# Patient Record
Sex: Female | Born: 1935 | Race: Black or African American | Hispanic: No | Smoking: Never smoker
Health system: Southern US, Community
[De-identification: ages and names within clinical notes are randomized; demographics above are authoritative.]

## PROBLEM LIST (undated history)

## (undated) DIAGNOSIS — C801 Malignant (primary) neoplasm, unspecified: Secondary | ICD-10-CM

## (undated) DIAGNOSIS — I10 Essential (primary) hypertension: Secondary | ICD-10-CM

## (undated) DIAGNOSIS — J45909 Unspecified asthma, uncomplicated: Secondary | ICD-10-CM

## (undated) HISTORY — PX: KNEE SURGERY: SHX244

## (undated) HISTORY — DX: Essential (primary) hypertension: I10

## (undated) HISTORY — PX: ORIF FIBULA FRACTURE: SHX2121

## (undated) HISTORY — DX: Unspecified asthma, uncomplicated: J45.909

---

## 2010-08-28 ENCOUNTER — Inpatient Hospital Stay (INDEPENDENT_AMBULATORY_CARE_PROVIDER_SITE_OTHER)
Admission: RE | Admit: 2010-08-28 | Discharge: 2010-08-28 | Disposition: A | Discharge status: Home / Self Care | Payer: 59 | Source: Ambulatory Visit | Attending: Family Medicine | Admitting: Family Medicine

## 2010-08-28 DIAGNOSIS — I10 Essential (primary) hypertension: Secondary | ICD-10-CM

## 2010-08-28 LAB — POCT I-STAT, CHEM 8
BUN: 16 mg/dL (ref 6–23)
Calcium, Ion: 1.23 mmol/L (ref 1.12–1.32)
Chloride: 102 mEq/L (ref 96–112)
Glucose, Bld: 112 mg/dL — ABNORMAL HIGH (ref 70–99)

## 2013-04-06 ENCOUNTER — Telehealth: Payer: Self-pay

## 2013-04-06 NOTE — Telephone Encounter (Signed)
No answer no VM

## 2013-04-09 ENCOUNTER — Encounter: Payer: Self-pay | Admitting: Family Medicine

## 2013-04-09 ENCOUNTER — Encounter: Payer: Self-pay | Admitting: General Practice

## 2013-04-09 ENCOUNTER — Ambulatory Visit (INDEPENDENT_AMBULATORY_CARE_PROVIDER_SITE_OTHER): Payer: Medicare HMO | Admitting: Family Medicine

## 2013-04-09 VITALS — BP 136/80 | HR 83 | Temp 98.1°F | Resp 16 | Ht 64.0 in | Wt 211.5 lb

## 2013-04-09 DIAGNOSIS — E669 Obesity, unspecified: Secondary | ICD-10-CM

## 2013-04-09 DIAGNOSIS — H409 Unspecified glaucoma: Secondary | ICD-10-CM

## 2013-04-09 DIAGNOSIS — I10 Essential (primary) hypertension: Secondary | ICD-10-CM

## 2013-04-09 LAB — HEPATIC FUNCTION PANEL
ALBUMIN: 3.9 g/dL (ref 3.5–5.2)
ALK PHOS: 58 U/L (ref 39–117)
ALT: 13 U/L (ref 0–35)
AST: 18 U/L (ref 0–37)
BILIRUBIN DIRECT: 0.1 mg/dL (ref 0.0–0.3)
Total Bilirubin: 0.8 mg/dL (ref 0.3–1.2)
Total Protein: 7.5 g/dL (ref 6.0–8.3)

## 2013-04-09 LAB — LDL CHOLESTEROL, DIRECT: LDL DIRECT: 135.7 mg/dL

## 2013-04-09 LAB — BASIC METABOLIC PANEL
BUN: 10 mg/dL (ref 6–23)
CALCIUM: 9.7 mg/dL (ref 8.4–10.5)
CO2: 29 mEq/L (ref 19–32)
Chloride: 101 mEq/L (ref 96–112)
Creatinine, Ser: 0.8 mg/dL (ref 0.4–1.2)
GFR: 86.97 mL/min (ref >60.00)
GLUCOSE: 98 mg/dL (ref 70–99)
Potassium: 3.3 mEq/L — ABNORMAL LOW (ref 3.5–5.1)
SODIUM: 140 meq/L (ref 135–145)

## 2013-04-09 LAB — CBC WITH DIFFERENTIAL/PLATELET
BASOS ABS: 0 10*3/uL (ref 0.0–0.1)
Basophils Relative: 0.3 % (ref 0.0–3.0)
Eosinophils Absolute: 0.1 10*3/uL (ref 0.0–0.7)
Eosinophils Relative: 1.2 % (ref 0.0–5.0)
HCT: 38.4 % (ref 36.0–46.0)
HEMOGLOBIN: 12.7 g/dL (ref 12.0–15.0)
LYMPHS ABS: 1.6 10*3/uL (ref 0.7–4.0)
Lymphocytes Relative: 17.9 % (ref 12.0–46.0)
MCHC: 33 g/dL (ref 30.0–36.0)
MCV: 90.7 fl (ref 78.0–100.0)
MONO ABS: 0.6 10*3/uL (ref 0.1–1.0)
Monocytes Relative: 6.6 % (ref 3.0–12.0)
NEUTROS PCT: 74 % (ref 43.0–77.0)
Neutro Abs: 6.5 10*3/uL (ref 1.4–7.7)
Platelets: 194 10*3/uL (ref 150.0–400.0)
RBC: 4.24 Mil/uL (ref 3.87–5.11)
RDW: 14.3 % (ref 11.5–14.6)
WBC: 8.8 10*3/uL (ref 4.5–10.5)

## 2013-04-09 LAB — LIPID PANEL
Cholesterol: 270 mg/dL — ABNORMAL HIGH (ref 0–200)
HDL: 104.9 mg/dL (ref >39.00)
TRIGLYCERIDES: 60 mg/dL (ref 0.0–149.0)
Total CHOL/HDL Ratio: 3
VLDL: 12 mg/dL (ref 0.0–40.0)

## 2013-04-09 LAB — TSH: TSH: 1.72 u[IU]/mL (ref 0.35–5.50)

## 2013-04-09 MED ORDER — LOSARTAN POTASSIUM 100 MG PO TABS: 100.0000 mg | ORAL_TABLET | Freq: Every day | ORAL | Status: DC

## 2013-04-09 MED ORDER — HYDROCHLOROTHIAZIDE 12.5 MG PO TABS: 12.5000 mg | ORAL_TABLET | Freq: Every day | ORAL | Status: DC

## 2013-04-09 NOTE — Progress Notes (Signed)
Pre visit review using our clinic review tool, if applicable. No additional management support is needed unless otherwise documented below in the visit note. 

## 2013-04-09 NOTE — Assessment & Plan Note (Signed)
New to provider, ongoing for pt.  Pt reports med is causing him dizziness.  Will split med into 2 components to try and lessen dizziness.  Check labs.  Will continue to follow.

## 2013-04-09 NOTE — Patient Instructions (Signed)
Follow up in 1 month to recheck BP Start the Hydrochlorothiazide daily, add the Losartan daily (2 pills, once daily) for blood pressure We'll notify you of your lab results and make any changes if needed Try and make healthy food choices and get regular exercise Call with any questions or concerns Welcome!  We're glad to have you!

## 2013-04-09 NOTE — Assessment & Plan Note (Signed)
New to provider, ongoing for pt.  Unable to exercise due to knee arthritis.  Not following any particular diet.  Check labs to risk stratify.  Will follow.

## 2013-04-09 NOTE — Assessment & Plan Note (Signed)
New to provider, ongoing for pt.  Following regularly w/ ophtho.

## 2013-04-09 NOTE — Progress Notes (Signed)
Subjective:    Patient ID: Hayley Wilson, female    DOB: 06/22/1934, 79 y.o.   MRN: [id number]  HPI New to establish.  Previous MD- [facility]  HTN- chronic problem, on Diovan HCT.  Was taking 1/2 tab daily due to dizziness.  Can take HCTZ alone w/out dizziness.  No CP, SOB, HAs, visual changes, edema.  Glaucoma- following regularly w/ Dr Chiquita Loth at [facility] Maintenance- overdue on PE, not getting mammograms- not interested.  Has never had colonoscopy, 'i don't want one'.  Has never had bone density, 'i don't think so'.  Refusing pneumonia.   Review of Systems For ROS see HPI     Objective:   Physical Exam  Vitals reviewed. Constitutional: She is oriented to person, place, and time. She appears well-developed and well-nourished. No distress.  obese  HENT:  Head: Normocephalic and atraumatic.  Eyes: Conjunctivae and EOM are normal. Pupils are equal, round, and reactive to light.  Neck: Normal range of motion. Neck supple. No thyromegaly present.  Cardiovascular: Normal rate, regular rhythm, normal heart sounds and intact distal pulses.   No murmur heard. Pulmonary/Chest: Effort normal and breath sounds normal. No respiratory distress.  Abdominal: Soft. She exhibits no distension. There is no tenderness.  Musculoskeletal: She exhibits no edema.  Lymphadenopathy:    She has no cervical adenopathy.  Neurological: She is alert and oriented to person, place, and time.  Skin: Skin is warm and dry.  Psychiatric: She has a normal mood and affect. Her behavior is normal.          Assessment & Plan:

## 2013-04-10 NOTE — Telephone Encounter (Signed)
Unable to reach pre visit.  

## 2013-05-01 ENCOUNTER — Telehealth: Payer: Self-pay | Admitting: Family Medicine

## 2013-05-01 NOTE — Telephone Encounter (Signed)
Relevant patient education mailed to patient.  

## 2013-05-10 ENCOUNTER — Encounter: Payer: Self-pay | Admitting: Family Medicine

## 2013-05-10 ENCOUNTER — Ambulatory Visit (INDEPENDENT_AMBULATORY_CARE_PROVIDER_SITE_OTHER): Payer: Medicare HMO | Admitting: Family Medicine

## 2013-05-10 VITALS — BP 126/80 | HR 81 | Temp 98.4°F | Resp 16 | Wt 206.2 lb

## 2013-05-10 DIAGNOSIS — I10 Essential (primary) hypertension: Secondary | ICD-10-CM

## 2013-05-10 LAB — BASIC METABOLIC PANEL WITH GFR
BUN: 21 mg/dL (ref 6–23)
CO2: 30 meq/L (ref 19–32)
Calcium: 9.3 mg/dL (ref 8.4–10.5)
Chloride: 99 meq/L (ref 96–112)
Creatinine, Ser: 1 mg/dL (ref 0.4–1.2)
GFR: 70.78 mL/min
Glucose, Bld: 90 mg/dL (ref 70–99)
Potassium: 3.5 meq/L (ref 3.5–5.1)
Sodium: 138 meq/L (ref 135–145)

## 2013-05-10 NOTE — Patient Instructions (Signed)
Schedule your complete physical in 6 months Continue the HCTZ, STOP the losartan Call with any questions or concerns Happy Valentine's Day!!!

## 2013-05-10 NOTE — Assessment & Plan Note (Signed)
Improved.  Pt unable to tolerate ARB due to HA and dizziness.  Continue HCTZ daily.  Check BMP due to mildly low K+ at last visit.

## 2013-05-10 NOTE — Progress Notes (Signed)
Pre visit review using our clinic review tool, if applicable. No additional management support is needed unless otherwise documented below in the visit note. 

## 2013-05-10 NOTE — Progress Notes (Signed)
Subjective:    Patient ID: Hayley Wilson, female    DOB: 02-24-1935, 79 y.o.   MRN: [id number]  HPI HTN- pt had Diovan HCT split into 2 components at last visit (Losartan and HCTZ).  Pt notes that Losartan, similar to Diovan, causes HAs and dizziness.  Stopped taking ARB but continued HCTZ w/o difficulty.  BP well controlled on just HCTZ.  No CP, SOB, HAs, visual changes, edema.   Review of Systems For ROS see HPI     Objective:   Physical Exam  Constitutional: She is oriented to person, place, and time. She appears well-developed and well-nourished. No distress.  HENT:  Head: Normocephalic and atraumatic.  Eyes: Conjunctivae and EOM are normal. Pupils are equal, round, and reactive to light.  Neck: Normal range of motion. Neck supple. No thyromegaly present.  Cardiovascular: Normal rate, regular rhythm, normal heart sounds and intact distal pulses.   No murmur heard. Pulmonary/Chest: Effort normal and breath sounds normal. No respiratory distress.  Abdominal: Soft. She exhibits no distension. There is no tenderness.  Musculoskeletal: She exhibits no edema.  Lymphadenopathy:    She has no cervical adenopathy.  Neurological: She is alert and oriented to person, place, and time.  Skin: Skin is warm and dry.  Psychiatric: She has a normal mood and affect. Her behavior is normal.          Assessment & Plan:

## 2013-05-11 ENCOUNTER — Telehealth: Payer: Self-pay | Admitting: Family Medicine

## 2013-05-11 ENCOUNTER — Encounter: Payer: Self-pay | Admitting: General Practice

## 2013-05-11 NOTE — Telephone Encounter (Signed)
Relevant patient education mailed to patient.  

## 2013-10-29 ENCOUNTER — Encounter: Payer: Medicare HMO | Admitting: Family Medicine

## 2013-12-24 ENCOUNTER — Telehealth: Payer: Self-pay | Admitting: Family Medicine

## 2013-12-24 NOTE — Telephone Encounter (Signed)
Patient Information:  Caller Name: Deziah  Phone: [phone]  Patient: Hayley Wilson  Gender: Female  DOB: 11-27-1934  Age: 79 Years  PCP: Sheliah Hatch.  Office Follow Up:  Does the office need to follow up with this patient?: Yes  Instructions For The Office: Please review, contact patient about her next step for her care. She can be reached at  [phone], please.  RN Note: Transferred from office/Stephanie requesting to determine if patient needs to be admitted to ER. When asking patient if she would be able to get to office today to be seen, told any movement from her home has to be 911 since on second floor.   Says she is wanting to be admitted to hospital, needing bed with equipment to be able to move some.  Symptoms  Reason For Call & Symptoms: In dental office laying back in chair for lengthy time on Wed 9/16.  When she got up seemed okay then turned around - did not feel dizzy but felt she was going down, tried to catch wall but was partition that did not hold, fell onto left hip.  Thought she was ok so went home.  Seen in ER Thurs 9/17 with xrays - Dx fractured pelvis.  Using walker but very slowly, told not to put much weight on left leg.  Now feeling bad since Thursday 9/24, no energy, just feels terrible.  Both legs and feet at ankles have become numb like she has been laying on them, does not go away.  Pain decreased when taking Motrin but still pain.  No way to get comfortable at all when sitting due to increased pain.  Not eating as well but trying to drink plenty of fluids.  Notes head feeling funny now but does not feel dizzy - usually lasting only few minutes at a time.  "just feels terrible"  Reviewed Health History In EMR: Yes  Reviewed Medications In EMR: Yes  Reviewed Allergies In EMR: Yes  Reviewed Surgeries / Procedures: No  Date of Onset of Symptoms: 12/12/2013  Treatments Tried: Motrin helps some  Treatments Tried Worked: Yes  Guideline(s) Used:  Hip  Injury  Disposition Per Guideline:   See Today in Office  Reason For Disposition Reached:   Patient wants to be seen  Advice Given:  N/A  Patient Refused Recommendation:  Patient Refused Care Advice  Can only come to  office using 911 due to second floor residence.  Wanting to be admitted to hospital until she feels better.

## 2013-12-24 NOTE — Telephone Encounter (Signed)
error 

## 2013-12-24 NOTE — Telephone Encounter (Signed)
Pt prefers to be admitted.  Therefore patient was advised to call 911 and explain to them her symptoms.  Pt stated understanding and agreed.  No further needs voiced at this time.

## 2013-12-25 DIAGNOSIS — Z0279 Encounter for issue of other medical certificate: Secondary | ICD-10-CM

## 2013-12-26 ENCOUNTER — Emergency Department (HOSPITAL_COMMUNITY): Payer: Medicare HMO

## 2013-12-26 ENCOUNTER — Emergency Department (HOSPITAL_COMMUNITY)
Admission: EM | Admit: 2013-12-26 | Discharge: 2013-12-26 | Disposition: A | Discharge status: Home / Self Care | Payer: Medicare HMO | Attending: Emergency Medicine | Admitting: Emergency Medicine

## 2013-12-26 ENCOUNTER — Encounter (HOSPITAL_COMMUNITY): Payer: Self-pay | Admitting: Emergency Medicine

## 2013-12-26 DIAGNOSIS — R296 Repeated falls: Secondary | ICD-10-CM | POA: Insufficient documentation

## 2013-12-26 DIAGNOSIS — S32509A Unspecified fracture of unspecified pubis, initial encounter for closed fracture: Secondary | ICD-10-CM | POA: Insufficient documentation

## 2013-12-26 DIAGNOSIS — S32591A Other specified fracture of right pubis, initial encounter for closed fracture: Secondary | ICD-10-CM

## 2013-12-26 DIAGNOSIS — Y939 Activity, unspecified: Secondary | ICD-10-CM | POA: Diagnosis not present

## 2013-12-26 DIAGNOSIS — I10 Essential (primary) hypertension: Secondary | ICD-10-CM | POA: Insufficient documentation

## 2013-12-26 DIAGNOSIS — IMO0002 Reserved for concepts with insufficient information to code with codable children: Secondary | ICD-10-CM | POA: Diagnosis present

## 2013-12-26 DIAGNOSIS — Y929 Unspecified place or not applicable: Secondary | ICD-10-CM | POA: Diagnosis not present

## 2013-12-26 DIAGNOSIS — J45909 Unspecified asthma, uncomplicated: Secondary | ICD-10-CM | POA: Diagnosis not present

## 2013-12-26 DIAGNOSIS — S32592A Other specified fracture of left pubis, initial encounter for closed fracture: Secondary | ICD-10-CM

## 2013-12-26 DIAGNOSIS — Z79899 Other long term (current) drug therapy: Secondary | ICD-10-CM | POA: Insufficient documentation

## 2013-12-26 LAB — URINALYSIS, ROUTINE W REFLEX MICROSCOPIC
Glucose, UA: NEGATIVE mg/dL
Hgb urine dipstick: NEGATIVE
KETONES UR: 15 mg/dL — AB
LEUKOCYTES UA: NEGATIVE
Nitrite: NEGATIVE
PROTEIN: NEGATIVE mg/dL
Specific Gravity, Urine: 1.027 (ref 1.005–1.030)
UROBILINOGEN UA: 1 mg/dL (ref 0.0–1.0)
pH: 5.5 (ref 5.0–8.0)

## 2013-12-26 LAB — CBC WITH DIFFERENTIAL/PLATELET
BASOS ABS: 0 10*3/uL (ref 0.0–0.1)
BASOS PCT: 0 % (ref 0–1)
EOS ABS: 0 10*3/uL (ref 0.0–0.7)
EOS PCT: 0 % (ref 0–5)
HCT: 38.8 % (ref 36.0–46.0)
Hemoglobin: 12.7 g/dL (ref 12.0–15.0)
LYMPHS ABS: 1.4 10*3/uL (ref 0.7–4.0)
Lymphocytes Relative: 11 % — ABNORMAL LOW (ref 12–46)
MCH: 29.3 pg (ref 26.0–34.0)
MCHC: 32.7 g/dL (ref 30.0–36.0)
MCV: 89.4 fL (ref 78.0–100.0)
Monocytes Absolute: 1 10*3/uL (ref 0.1–1.0)
Monocytes Relative: 8 % (ref 3–12)
NEUTROS PCT: 81 % — AB (ref 43–77)
Neutro Abs: 10 10*3/uL — ABNORMAL HIGH (ref 1.7–7.7)
PLATELETS: 220 10*3/uL (ref 150–400)
RBC: 4.34 MIL/uL (ref 3.87–5.11)
RDW: 13.3 % (ref 11.5–15.5)
WBC: 12.4 10*3/uL — ABNORMAL HIGH (ref 4.0–10.5)

## 2013-12-26 LAB — BASIC METABOLIC PANEL
ANION GAP: 13 (ref 5–15)
BUN: 22 mg/dL (ref 6–23)
CALCIUM: 9.8 mg/dL (ref 8.4–10.5)
CO2: 26 mEq/L (ref 19–32)
Chloride: 102 mEq/L (ref 96–112)
Creatinine, Ser: 0.8 mg/dL (ref 0.50–1.10)
GFR, EST AFRICAN AMERICAN: 80 mL/min — AB (ref >90)
GFR, EST NON AFRICAN AMERICAN: 69 mL/min — AB (ref >90)
Glucose, Bld: 95 mg/dL (ref 70–99)
POTASSIUM: 3.5 meq/L — AB (ref 3.7–5.3)
SODIUM: 141 meq/L (ref 137–147)

## 2013-12-26 MED ORDER — MORPHINE SULFATE 2 MG/ML IJ SOLN: 2.0000 mg | INTRAMUSCULAR | Status: DC | PRN

## 2013-12-26 MED ORDER — HYDROCODONE-ACETAMINOPHEN 5-325 MG PO TABS: 1.0000 | ORAL_TABLET | ORAL | Status: DC | PRN

## 2013-12-26 NOTE — ED Notes (Signed)
Pt was able to ambulate with a walker, and assistance.  Pt is now sitting in the chair in the room

## 2013-12-26 NOTE — ED Notes (Signed)
Bedside reported from previous RN, Velna HatchetSheila.

## 2013-12-26 NOTE — Discharge Instructions (Signed)
Stable Pelvic Fracture °You have one or more fractures (this means there is a break in the bones) of the pelvis. The pelvis is the ring of bones that make up your hipbones. These are the bones you sit on and the lower part of the spine. It is like a boney ring where your legs attach and which supports your upper body. You have an undisplaced fracture. This means the bones are in good position. The pelvic fracture you have is a simple (uncomplicated) fracture. °DIAGNOSIS  °X-rays usually diagnose these fractures. °TREATMENT  °The goal of treating pelvic fractures is to get the bones to heal in a good position. The patient should return to normal activities as soon as possible. Such fractures are often treated with normal bed rest and conservative measures.  °HOME CARE INSTRUCTIONS  °· You should be on bed rest for as long as directed by your caregiver. Change positions of your legs every 1-2 hours to maintain good blood flow. You may sit as long as is tolerable. Following this, you may do usual activities, but avoid strenuous activities for as long as directed by your caregiver. °· Only take over-the-counter or prescription medicines for pain, discomfort, or fever as directed by your caregiver. °· Bed rest may also be used for discomfort. °· Resume your activities when you are able. Use a cane or crutch on the injured side to reduce pain while walking, as needed. °· If you develop increased pain or discomfort not relieved with medications, contact your caregiver. °· Warning: Do not drive a car or operate a motor vehicle until your caregiver specifically tells you it is safe to do so. °SEEK IMMEDIATE MEDICAL CARE IF:  °· You feel light-headed or faint, develop chest pain or shortness of breath. °· An unexplained oral temperature above 102° F (38.9° C) develops. °· You develop blood in the urine or in the stools. °· There is difficulty urinating, and/or having a bowel movement, or pain with these efforts. °· There is a  difficulty or increased pain with walking. °· There is swelling in one or both legs that is not normal. °Document Released: 05/24/2001 Document Revised: 07/30/2013 Document Reviewed: 10/27/2007 °ExitCare® Patient Information ©2015 [employer]. This information is not intended to replace advice given to you by your health care provider. Make sure you discuss any questions you have with your health care provider. ° °

## 2013-12-26 NOTE — Progress Notes (Signed)
  CARE MANAGEMENT ED NOTE 12/26/2013  Patient:  Hayley Wilson,Hayley Wilson   Account Number:  [id number]  Date Initiated:  12/26/2013  Documentation initiated by:  Radford PaxFERRERO,Chanya Chrisley  Subjective/Objective Assessment:   Patient presents to Ed post fall on 09/12 sustaining a pelvic fracture.     Subjective/Objective Assessment Detail:   Patient concerned she is unable to take care of herself at home, feels like she is declinig.     Action/Plan:   Action/Plan Detail:   Anticipated DC Date:  12/26/2013     Status Recommendation to Physician:   Result of Recommendation:    Other ED Services  Consult Working Plan    DC Planning Services  CM consult  Other    Choice offered to / List presented to:            Status of service:  Completed, signed off  ED Comments:   ED Comments Detail:  EDCM consulted to see patient regarding home health services.  Patient reports she lives at home with her daughter.  Patient reorts he daughter works.  "They have a hard time getting me upa nd down those steps."  Patient reports she has a walker at home.  Patient able to ambulate with walker in the ED.  Patient fell on the 18th of September.  [facility] asked [facility] if she would be intersted in home health services?  Patient reports, "I don't even want to hear about home.  I said to myself, I have to get out of here to get to a facility.  I fell like I'Wilson declinig at home.  Can't you keep me for a couple of nights?  It's easier to go to the bathroom at the hospital and at a facility.  Home health ain't gonna do me no good.  How they going to get in?  EDCM initially thought patient had Medicare insurance and explained to patient Medicare guidelines for qualifying stay.  Patient has [employer] Medicare insurnce.  [facility] asked patient [facility] times if she was sure she didn't want home health.  EDCM explained to her that with home health, a social worker can assist her to get into a facility from home.  Patient reports she understands and  stated, "I don't even want to hear the word home i want to go to a facility."  [facility] also informed patient that her pcp can assist her to get into a facility and can also set her up with homehealth if she changes her mind.  EDCM explained with home health she can receive a visiting RN, PT, OT aide and Child psychotherapistsocial worker.  EDCM explained to patient that she does not have a reason for hospitalization currently.  EDCM discussed patient with EDP, EDSW.  EDCM provided patient with [place] of [place] and a private duty nursing list.  [facility] explained that private duty nursing will be an out of pocket expense.  Patient thankful for resources. [facility] asked [facility] if there was any equipment she needed at home sucha s bedside commode, patient refused DME.   Patient reports she is going to call her doctor.  Patient voiced concern for steps in her home.  EDCM spoke to charge nurse regarding transport home.  No furhter EDCM needs at this time.

## 2013-12-26 NOTE — ED Notes (Signed)
Bed: [id number] Expected date:  Expected time:  Means of arrival:  Comments: EMS known pelvic fracture

## 2013-12-26 NOTE — ED Notes (Signed)
Per EMS- Patient fell on 12/14/13, called EMS on 12/16/13, [facility] and was [facility] with [facility] pelvic fracture. Paatientwas discharged on 12/16/13. Patient called her PCP today and was requesting stronger pain meds and reported that she was having numbness and tingling to bilateral ankles and feet. Patient's PCP instructed patient to call EMS and go to the ED.

## 2013-12-26 NOTE — ED Provider Notes (Addendum)
CSN: [id number]     Arrival date & time 12/26/13  1807 History   First MD Initiated Contact with Patient 12/26/13 1833     Chief Complaint  Patient presents with  . pelvic fracture      HPI Pt had been at the dentists office for a couple of hours on the 18th.   After getting up she fell.  She had xrays that showed a pelvic fracture.  She has been taking medications for pain, ibuprofen.  She was also taking tramadol.  She called her doctor today and was told to come to the hospital.  Pt states her feet were feeling numb and tingling.  She has mostly been staying at home by herself.  Pt is concerned about being able to care for herself at home with this injury. Past Medical History  Diagnosis Date  . Asthma   . Hypertension    Past Surgical History  Procedure Laterality Date  . Knee surgery    . Orif fibula fracture     Family History  Problem Relation Age of Onset  . Arthritis Mother   . Arthritis Maternal Aunt   . Diabetes Paternal Uncle   . Stroke Maternal Grandmother    History  Substance Use Topics  . Smoking status: Never Smoker   . Smokeless tobacco: Never Used  . Alcohol Use: No   OB History   Grav Para Term Preterm Abortions TAB SAB Ect Mult Living                 Review of Systems  All other systems reviewed and are negative.     Allergies  Allopurinol  Home Medications   Prior to Admission medications   Medication Sig Start Date End Date Taking? Authorizing Provider  dorzolamide (TRUSOPT) 2 % ophthalmic solution Place 1 drop into both eyes 3 (three) times daily.   Yes Historical Provider, MD  DORZOLAMIDE HCL-TIMOLOL MAL OP Apply 1 drop to eye daily.   Yes Historical Provider, MD  hydrochlorothiazide (HYDRODIURIL) 12.5 MG tablet Take 1 tablet (12.5 mg total) by mouth daily. 04/09/13  Yes Sheliah Hatch, MD   BP 122/76  Pulse 85  Temp(Src) 98.8 F (37.1 C) (Oral)  Resp 18  SpO2 94% Physical Exam  Nursing note and vitals  reviewed. Constitutional: She appears well-developed and well-nourished. No distress.  HENT:  Head: Normocephalic and atraumatic.  Right Ear: External ear normal.  Left Ear: External ear normal.  Eyes: Conjunctivae are normal. Right eye exhibits no discharge. Left eye exhibits no discharge. No scleral icterus.  Neck: Neck supple. No tracheal deviation present.  Cardiovascular: Normal rate, regular rhythm and intact distal pulses.   Pulmonary/Chest: Effort normal and breath sounds normal. No stridor. No respiratory distress. She has no wheezes. She has no rales.  Abdominal: Soft. Bowel sounds are normal. She exhibits no distension. There is no tenderness. There is no rebound and no guarding.  Musculoskeletal: She exhibits no edema and no tenderness.       Left hip: She exhibits decreased range of motion. She exhibits no bony tenderness and no swelling.  ttp left pelvis, no instability  Neurological: She is alert. She has normal strength. No cranial nerve deficit (no facial droop, extraocular movements intact, no slurred speech) or sensory deficit. She exhibits normal muscle tone. She displays no seizure activity. Coordination normal.  Skin: Skin is warm and dry. No rash noted.  Psychiatric: She has a normal mood and affect.    ED Course  Procedures (including critical care time) Labs Review Labs Reviewed  CBC WITH DIFFERENTIAL - Abnormal; Notable for the following:    WBC 12.4 (*)    Neutrophils Relative % 81 (*)    Neutro Abs 10.0 (*)    Lymphocytes Relative 11 (*)    All other components within normal limits  BASIC METABOLIC PANEL - Abnormal; Notable for the following:    Potassium 3.5 (*)    GFR calc non Af Amer 69 (*)    GFR calc Af Amer 80 (*)    All other components within normal limits  URINALYSIS, ROUTINE W REFLEX MICROSCOPIC - Abnormal; Notable for the following:    Color, Urine AMBER (*)    APPearance CLOUDY (*)    Bilirubin Urine MODERATE (*)    Ketones, ur 15 (*)     All other components within normal limits    Imaging Review Dg Pelvis 1-2 Views  12/26/2013   CLINICAL DATA:  Status post fall at 1 week ago with persistent left hip and groin pain.  EXAM: PELVIS - 1-2 VIEW  COMPARISON:  Left hip series of December 14, 2013.  FINDINGS: Fractures of the left superior and inferior pubic rami are again demonstrated. There is no significant periosteal reaction. The superior pubic ramus remain mildly displaced. Knee hip joint spaces are mildly narrowed bilaterally. There is no acute fracture of the visualized portions of the femurs. The observed portions of the sacrum are normal.  IMPRESSION: There are known fractures of the left superior inferior pubic rami. These have not changed significantly since the previous study.   Electronically Signed   By: David  [place]azil[place]   On: 12/26/2013 20:02   Medications  morphine 2 MG/ML injection 2-4 mg (not administered)     MDM   Final diagnoses:  Bilateral pubic rami fractures, closed, initial encounter    Pt sustained a pelvic fracture on 9/12.  She has persistent pain.  Pt is interested in some type of rehab facility.  No acute indication to admit her to the hospital.  Will make sure she can ambulate.  Will dc home with pain meds and order a  Home health assessment.   Linwood DibblesJon Keyler Hoge, MD 12/26/13 2124  Pt was able to walk with her walker.  Will dc home with pain meds.  Home health referral.  Linwood DibblesJon Rashea Hoskie, MD 12/26/13 2145

## 2013-12-26 NOTE — ED Notes (Signed)
Pt transported to Xray. 

## 2013-12-26 NOTE — ED Notes (Signed)
[facility] called to transport patient  

## 2013-12-26 NOTE — ED Notes (Signed)
Daughter, Aram BeechamCynthia, notified of patient's discharge and resources that are available for patient.  Daughter states that patient lives with her and that she will be available at residence when [facility].

## 2013-12-27 NOTE — Progress Notes (Signed)
12/27/2013 A. Lansing Sigmon RNCM 1641pm EDCM called patient again without success.  Left voice mail message and phonenumber to call EDCM back.

## 2013-12-27 NOTE — Progress Notes (Signed)
12/27/2013 A. Cristen Bredeson RNCM 1530pm EDCM called patient for follow up without success.  Phone with busy signal, no answer.

## 2013-12-27 NOTE — Telephone Encounter (Addendum)
After a lengthy conversation with the patient, she stated that she did not want to come in.  She wants Dr. Beverely Lowabori to review her records and make a determination of next steps (pt wants to be admitted into rehab) based on the ER report from 12/24/13.  Even after explaining to the patient that she would need to have a face-to-face encounter with Dr. Beverely Lowabori in order for her to make recommendations due to insurance purposes, the patient still refused to come in.  She stated that she is unable to go down the steps in her apartment and will require EMS to bring her to the office.  She stated that she did not want to spend unnecessary money to have EMS come and pick her up and bring her in only to have the same tests ran that have already been run.  Instead she would rather stay home continue taking her pain medication as prescribed, and allow her hip to heal on its own.    Called daughter, Aram BeechamCynthia, who stated that patient did not have any other choice but to come in and be evaluated by Dr. Beverely Lowabori.  After the importance of the patient being seen sooner was discussed, the daughter stated that she would try and have the patient brought in on Tuesday, but she would have to make certain arrangements.  She said she would call us back tomorrow or Monday to make us aware of whether or not she could arrange for patient to come in on Tuesday, otherwise, patient would come to office on Friday as scheduled.

## 2013-12-28 NOTE — Telephone Encounter (Signed)
I have reviewed pt's ER notes and imaging.  I am aware of her situation.  Unfortunately, there is nothing I can do w/o having a face-to-face encounter for insurance billing purposes (home health is unable to see her w/o this).  If she desires admission, her daughter can call various facilities and enroll her- I cannot do this- or she can be placed in a facility from the hospital.  I would again recommend her seeing a provider in the office today and not waiting until next week.

## 2013-12-31 NOTE — Telephone Encounter (Addendum)
Daughter returned call, Dr. Rennis Goldenabori's note was reviewed with her.  She stated that she would rather bring the patient in for an appointment and that she definitely wanted to keep the appointment on Friday as she's having difficulty arranging transportation for her to come in tomorrow.  Daughter also inquired about how to establish medical power of attorney.  She was advised that she have to go through an attorney in order to have this done.  She stated understanding.  No further question or concerns at this time.

## 2013-12-31 NOTE — Telephone Encounter (Signed)
Pt's daughter did not call back to reschedule appointment.  Called daughter and left a message for call back.

## 2014-01-04 ENCOUNTER — Encounter: Payer: Self-pay | Admitting: Family Medicine

## 2014-01-04 ENCOUNTER — Ambulatory Visit (INDEPENDENT_AMBULATORY_CARE_PROVIDER_SITE_OTHER): Payer: Medicare HMO | Admitting: Family Medicine

## 2014-01-04 VITALS — BP 132/86 | HR 86 | Temp 98.3°F | Resp 17

## 2014-01-04 DIAGNOSIS — Z111 Encounter for screening for respiratory tuberculosis: Secondary | ICD-10-CM

## 2014-01-04 DIAGNOSIS — S329XXA Fracture of unspecified parts of lumbosacral spine and pelvis, initial encounter for closed fracture: Secondary | ICD-10-CM | POA: Insufficient documentation

## 2014-01-04 NOTE — Addendum Note (Signed)
Addended by: Sheliah HatchABORI, Melena Hayes E on: 01/04/2014 01:01 PM   Modules accepted: Orders

## 2014-01-04 NOTE — Progress Notes (Signed)
Subjective:    Patient ID: Hayley Wilson, female    DOB: [birth year], 79 y.o.   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  Pt was transported to [facility].  dx'd w/ superior and inferior L pelvic rami fx w/ mild displacement of superior fx.  pt was given tramadol but this 'didn't agree' w/ pt.  On Ibuprofen.  Pt reports pain is manageable but she is not moving.  Did not do surgery.  Pt went to ER on 9/30 due to pain and xrays were unchanged- showing mild displacement of L superior ramus.  Pt is very limited in her movement.  Pt requiring assistance to use restroom, bathing, dressing.  Pt is unable to let people into the home to assist her b/c the front door is down 14 stairs and pt not able to ambulate.  Pt and family are wanting to go to a facility for rehab.  Has not seen ortho.  Wants me to direct admit them.  Pt reports she is very anxious and has decreased appetite since falling.   Review of Systems For ROS see HPI     Objective:   Physical Exam  Vitals reviewed. Constitutional: She is oriented to person, place, and time. She appears well-developed and well-nourished.  Lying on stretcher  HENT:  Head: Normocephalic and atraumatic.  Cardiovascular: Normal rate, regular rhythm and normal heart sounds.   Pulmonary/Chest: Effort normal and breath sounds normal. No respiratory distress. She has no wheezes. She has no rales.  Neurological: She is alert and oriented to person, place, and time.  Skin: Skin is warm and dry.  Psychiatric: She has a normal mood and affect. Her behavior is normal. Thought content normal.          Assessment & Plan:

## 2014-01-04 NOTE — Patient Instructions (Signed)
The PPD will need to be read on Sunday or Monday- the home health nurse can do this or you need to return to the office Keep the copy of the FL2 form- this is required for nursing home/rehab placement The [facility] nurse will contact you regarding a home visit today/tomorrow Please try and be flexible and come up with a way to allow home entry to the home health people- they will do everything to work with you They are going to work with you on placement in a facility Call with any questions or concerns Hang in there!!!

## 2014-01-04 NOTE — Assessment & Plan Note (Addendum)
New.  Pt is in a very difficult situation.  She has not seen ortho.  Has no timeline of how long her recovery take or what she is allowed to do in regards to weight bearing/PT.  Unable to use the restroom, bathe, or dress w/o assistance and daughter spends the day at work- leaving pt alone.  Pt unable to answer front door to let Home Health in to assist her as the front door is down 14 steps from the living area.  Pt and daughter want her placed in a rehab facility.  Explained that I am unable to do this directly and that they will need to work w/ a Child psychotherapistsocial worker from [facility] to make this happen.  They then wanted me to do a direct hospital admit- I explained they do not meet criteria for inpt services (as was explained to them at the ER)  Insurance may or may not cover a rehab stay b/c she did not have a hospitalization.  Explained to daughter that usually the family and social work team work together to find an appropriate facility and complete the necessary steps.  Pt had PPD placed and FL2 completed today in hopes of rapid placement.  PPD will need to be read by Home Health on Sunday/Monday or pt will need to return to office on Monday.  Pt and family aware.  Will refer to ortho.  Spoke w/ Home Health nurse via phone and will order RN assessment for this weekend in hopes they can work out some arrangement for entry into the home to provide care to pt until she is placed.  Will also get PT safety assessment, social work, and home health aide.  Pt and daughter in agreement w/ plan but are not happy about it.  Total time spent w/ pt, 42 minutes, >50% spent counseling.

## 2014-01-04 NOTE — Progress Notes (Signed)
Pre visit review using our clinic review tool, if applicable. No additional management support is needed unless otherwise documented below in the visit note. 

## 2014-01-08 ENCOUNTER — Telehealth: Payer: Self-pay | Admitting: Family Medicine

## 2014-01-08 NOTE — Telephone Encounter (Signed)
Completed forms received back from Dr. Beverely Lowabori. Copy made for scanning, originals placed up front for patient. Faxed forms to [place]. Called and left detailed message for daughter. JG//CMA

## 2014-01-08 NOTE — Telephone Encounter (Signed)
FMLA forms given to Dr. Beverely Lowabori. JG//CMA

## 2014-01-08 NOTE — Telephone Encounter (Signed)
Have you seen these forms?  I know she wanted us to complete them w/o an appt- but she came in for appt last week and now wants them done

## 2014-01-08 NOTE — Telephone Encounter (Signed)
Caller name:cynthia Dymek Relation to EA:[id number]:daughter Call back number:[phone] Pharmacy:  Reason for call: pt's daughter is wanting to make sure that dr. Beverely Lowtabori fills out her fmla paperwork and would like for her to call her and let her know when it is ready.

## 2014-01-14 ENCOUNTER — Telehealth: Payer: Self-pay | Admitting: *Deleted

## 2014-01-14 DIAGNOSIS — S329XXD Fracture of unspecified parts of lumbosacral spine and pelvis, subsequent encounter for fracture with routine healing: Secondary | ICD-10-CM

## 2014-01-14 NOTE — Telephone Encounter (Signed)
Received signed forms from Dr. Beverely Lowabori. Forms [facility] to [facility] at [phone]

## 2014-01-14 NOTE — Telephone Encounter (Signed)
Received home health certification and plan of care via fax from [facility]. Billing sheet attached and forms forwarded to Dr. Beverely Lowabori for review/signature. JG//CMA

## 2014-01-17 ENCOUNTER — Telehealth: Payer: Self-pay | Admitting: *Deleted

## 2014-01-17 NOTE — Telephone Encounter (Signed)
Patient's daughter, Larrie KassCynthia Luu, dropped off application for handicap placard. She also dropped off paperwork that needs to be completed for herself (prior paperwork was not turned in on time). Forms completed and forwarded to Dr. Beverely Lowabori for review/signature. JG//CMA

## 2014-01-21 NOTE — Telephone Encounter (Signed)
Received completed forms from Dr. Beverely Lowabori. FMLA was faxed and originals mailed to Larrie Kassynthia [place] Placard was mailed to patient. JG//CMA

## 2014-03-15 ENCOUNTER — Other Ambulatory Visit: Payer: Self-pay | Admitting: General Practice

## 2014-03-15 DIAGNOSIS — I10 Essential (primary) hypertension: Secondary | ICD-10-CM

## 2014-03-15 MED ORDER — HYDROCHLOROTHIAZIDE 12.5 MG PO TABS: 12.5000 mg | ORAL_TABLET | Freq: Every day | ORAL | Status: DC

## 2014-05-31 DIAGNOSIS — I10 Essential (primary) hypertension: Secondary | ICD-10-CM | POA: Diagnosis not present

## 2014-07-26 DIAGNOSIS — H4011X3 Primary open-angle glaucoma, severe stage: Secondary | ICD-10-CM | POA: Diagnosis not present

## 2014-07-31 DIAGNOSIS — I10 Essential (primary) hypertension: Secondary | ICD-10-CM | POA: Diagnosis not present

## 2014-07-31 DIAGNOSIS — R42 Dizziness and giddiness: Secondary | ICD-10-CM | POA: Diagnosis not present

## 2014-10-07 DIAGNOSIS — H2511 Age-related nuclear cataract, right eye: Secondary | ICD-10-CM | POA: Diagnosis not present

## 2014-10-07 DIAGNOSIS — H4011X3 Primary open-angle glaucoma, severe stage: Secondary | ICD-10-CM | POA: Diagnosis not present

## 2014-11-21 ENCOUNTER — Telehealth: Payer: Self-pay

## 2014-11-21 NOTE — Telephone Encounter (Signed)
Called to schedule Medicare Wellness Visit with [facility].  Left a message for call back.  

## 2014-12-04 NOTE — Telephone Encounter (Signed)
Left a message for call back.  

## 2014-12-31 NOTE — Telephone Encounter (Signed)
Left a message for call back.  

## 2015-03-10 DIAGNOSIS — H401133 Primary open-angle glaucoma, bilateral, severe stage: Secondary | ICD-10-CM | POA: Diagnosis not present

## 2015-04-28 DIAGNOSIS — H401133 Primary open-angle glaucoma, bilateral, severe stage: Secondary | ICD-10-CM | POA: Diagnosis not present

## 2015-06-12 DIAGNOSIS — Z0000 Encounter for general adult medical examination without abnormal findings: Secondary | ICD-10-CM | POA: Diagnosis not present

## 2015-06-12 DIAGNOSIS — I10 Essential (primary) hypertension: Secondary | ICD-10-CM | POA: Diagnosis not present

## 2015-07-11 DIAGNOSIS — H401133 Primary open-angle glaucoma, bilateral, severe stage: Secondary | ICD-10-CM | POA: Diagnosis not present

## 2015-09-08 DIAGNOSIS — H401133 Primary open-angle glaucoma, bilateral, severe stage: Secondary | ICD-10-CM | POA: Diagnosis not present

## 2016-02-09 ENCOUNTER — Telehealth: Payer: Self-pay | Admitting: Family Medicine

## 2016-02-09 NOTE — Telephone Encounter (Signed)
Caller name: Shanda BumpsJessica with [facility] Can be reached: [phone] Fax: [phone]  Reason for call: needing [employer] auth for pt appt scheduled 02/18/16 11:20am  Devona Konigancy Meder DOB 13-Feb-1935 [employer] auth# [id number] valid 02/09/16-08/07/16 Requested and pending approval Faxed info to [employer]

## 2016-02-11 NOTE — Telephone Encounter (Signed)
Caller name: Geneta with [employer] Relationship to patient: Can be reached: [phone] Pharmacy:  ReasAcquanetta Chainon for call: Call from [employer] asking that we reach out to the patient and offer Dr. Wynell [facility]oonhristopher Weaver at [facility] as in network provider. We requested for Dr. Lottie DawsonBond who is out of network. Please notify Acquanetta ChainGeneta with pts decision (either for Dr. Alben SpittleWeaver or Dr. Lottie DawsonBond).

## 2016-02-13 NOTE — Telephone Encounter (Signed)
LM for Geneta to advise the [facility] and does want to see Dr. Lottie DawsonBond. Pt does not want to go to Dr. Alben SpittleWeaver. I asked that she reprocess the original request.

## 2016-02-16 NOTE — Telephone Encounter (Signed)
Referral approved in [employer]

## 2016-02-18 DIAGNOSIS — H401133 Primary open-angle glaucoma, bilateral, severe stage: Secondary | ICD-10-CM | POA: Diagnosis not present

## 2016-05-08 IMAGING — CR DG PELVIS 1-2V
1 series · 1 of 1 positions shown · non-contrast
Comparison: Left hip series of December 14, 2013.

CLINICAL DATA: Status post fall at 1 week ago with persistent left
hip and groin pain.

EXAM:
PELVIS - 1-2 VIEW

[x pelvis]
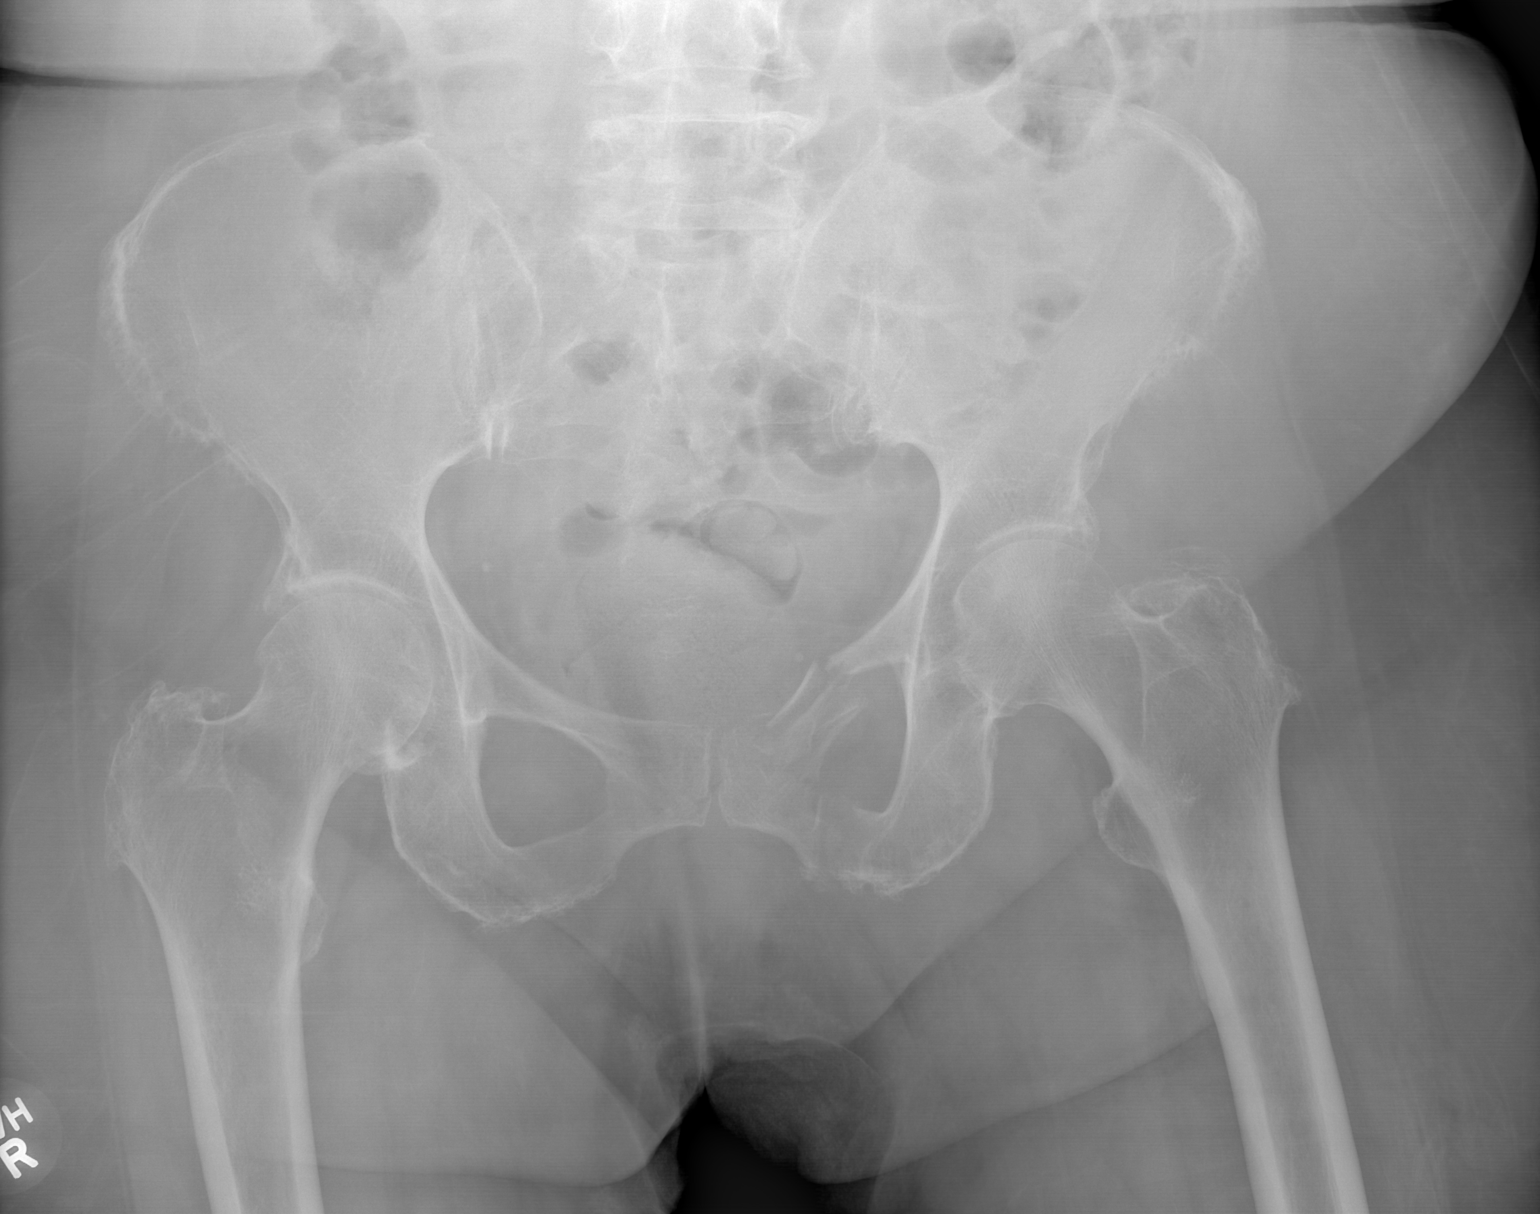

[1 of 1 positions shown; findings below may reference images not displayed]

FINDINGS: Fractures of the left superior and inferior pubic rami are again
demonstrated. There is no significant periosteal reaction. The
superior pubic ramus remain mildly displaced. Knee hip joint spaces
are mildly narrowed bilaterally. There is no acute fracture of the
visualized portions of the femurs. The observed portions of the
sacrum are normal.
IMPRESSION: There are known fractures of the left superior inferior pubic rami.
These have not changed significantly since the previous study.

## 2016-08-27 DIAGNOSIS — H401133 Primary open-angle glaucoma, bilateral, severe stage: Secondary | ICD-10-CM | POA: Diagnosis not present

## 2016-08-30 DIAGNOSIS — H401133 Primary open-angle glaucoma, bilateral, severe stage: Secondary | ICD-10-CM | POA: Diagnosis not present

## 2017-04-20 DIAGNOSIS — H401133 Primary open-angle glaucoma, bilateral, severe stage: Secondary | ICD-10-CM | POA: Diagnosis not present

## 2017-10-24 DIAGNOSIS — H401133 Primary open-angle glaucoma, bilateral, severe stage: Secondary | ICD-10-CM | POA: Diagnosis not present

## 2018-01-16 DIAGNOSIS — R51 Headache: Secondary | ICD-10-CM | POA: Diagnosis not present

## 2021-08-28 ENCOUNTER — Encounter (HOSPITAL_BASED_OUTPATIENT_CLINIC_OR_DEPARTMENT_OTHER): Payer: Self-pay | Admitting: Urology

## 2021-08-28 ENCOUNTER — Emergency Department (HOSPITAL_BASED_OUTPATIENT_CLINIC_OR_DEPARTMENT_OTHER)
Admission: EM | Admit: 2021-08-28 | Discharge: 2021-08-28 | Disposition: A | Discharge status: Home / Self Care | Payer: Medicare (Managed Care) | Attending: Emergency Medicine | Admitting: Emergency Medicine

## 2021-08-28 ENCOUNTER — Other Ambulatory Visit: Payer: Self-pay

## 2021-08-28 ENCOUNTER — Emergency Department (HOSPITAL_BASED_OUTPATIENT_CLINIC_OR_DEPARTMENT_OTHER): Payer: Medicare (Managed Care)

## 2021-08-28 DIAGNOSIS — R42 Dizziness and giddiness: Secondary | ICD-10-CM | POA: Diagnosis present

## 2021-08-28 DIAGNOSIS — I10 Essential (primary) hypertension: Secondary | ICD-10-CM | POA: Insufficient documentation

## 2021-08-28 DIAGNOSIS — Z79899 Other long term (current) drug therapy: Secondary | ICD-10-CM | POA: Insufficient documentation

## 2021-08-28 LAB — CBC WITH DIFFERENTIAL/PLATELET
Abs Immature Granulocytes: 0.01 10*3/uL (ref 0.00–0.07)
Basophils Absolute: 0 10*3/uL (ref 0.0–0.1)
Basophils Relative: 0 %
Eosinophils Absolute: 0.1 10*3/uL (ref 0.0–0.5)
Eosinophils Relative: 2 %
HCT: 39.3 % (ref 36.0–46.0)
Hemoglobin: 12.7 g/dL (ref 12.0–15.0)
Immature Granulocytes: 0 %
Lymphocytes Relative: 20 %
Lymphs Abs: 1.7 10*3/uL (ref 0.7–4.0)
MCH: 29 pg (ref 26.0–34.0)
MCHC: 32.3 g/dL (ref 30.0–36.0)
MCV: 89.7 fL (ref 80.0–100.0)
Monocytes Absolute: 0.6 10*3/uL (ref 0.1–1.0)
Monocytes Relative: 8 %
Neutro Abs: 5.8 10*3/uL (ref 1.7–7.7)
Neutrophils Relative %: 70 %
Platelets: 134 10*3/uL — ABNORMAL LOW (ref 150–400)
RBC: 4.38 MIL/uL (ref 3.87–5.11)
RDW: 13.2 % (ref 11.5–15.5)
WBC: 8.3 10*3/uL (ref 4.0–10.5)
nRBC: 0 % (ref 0.0–0.2)

## 2021-08-28 LAB — URINALYSIS, ROUTINE W REFLEX MICROSCOPIC
Bilirubin Urine: NEGATIVE
Glucose, UA: NEGATIVE mg/dL
Ketones, ur: NEGATIVE mg/dL
Nitrite: NEGATIVE
Protein, ur: 100 mg/dL — AB
Specific Gravity, Urine: 1.025 (ref 1.005–1.030)
pH: 6 (ref 5.0–8.0)

## 2021-08-28 LAB — BASIC METABOLIC PANEL
Anion gap: 6 (ref 5–15)
BUN: 10 mg/dL (ref 8–23)
CO2: 28 mmol/L (ref 22–32)
Calcium: 9.8 mg/dL (ref 8.9–10.3)
Chloride: 106 mmol/L (ref 98–111)
Creatinine, Ser: 0.88 mg/dL (ref 0.44–1.00)
GFR, Estimated: >60 mL/min (ref >60)
Glucose, Bld: 123 mg/dL — ABNORMAL HIGH (ref 70–99)
Potassium: 3.4 mmol/L — ABNORMAL LOW (ref 3.5–5.1)
Sodium: 140 mmol/L (ref 135–145)

## 2021-08-28 LAB — URINALYSIS, MICROSCOPIC (REFLEX): WBC, UA: >50 WBC/hpf (ref 0–5)

## 2021-08-28 MED ORDER — SODIUM CHLORIDE 0.9 % IV BOLUS
500.0000 mL | Freq: Once | INTRAVENOUS | Status: AC
  Administered 2021-08-28: 500 mL via INTRAVENOUS

## 2021-08-28 MED ORDER — MECLIZINE HCL 12.5 MG PO TABS: 12.5000 mg | ORAL_TABLET | Freq: Three times a day (TID) | ORAL | 0 refills | Status: DC | PRN

## 2021-08-28 NOTE — ED Provider Notes (Addendum)
[facility] EMERGENCY DEPARTMENT Provider Note   CSN: [id number] Arrival date & time: 08/28/21  1745     History  Chief Complaint  Patient presents with   Dizziness    Hayley Wilson is a 87 y.o. female.  Patient is a 87 year old female who presents with dizziness.  She states that for the last week she has had some dizziness and headache.  However the headache is resolved.  She states that started about a week ago when she was sitting on a couch and suddenly felt dizzy.  She felt like the room was spinning.  She did have an associated headache although the headache is gone away.  She says that the dizziness was bad for about 3 days.  It was worse when she was up walking around and better if she laid down.  Initially when it started she had some associated nausea and vomiting.  Was only the first day it started.  She says it is much better now but occasionally she will still have some dizziness whenever she bends over or is up walking around.  It does not seem to be related to specific head movements other than bending down.  No history of head trauma.  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  She does not notice any change in her eyes.  No increased pain in her eye.  No current headache.  No neck pain.  No nausea or vomiting.  She does not report any vision changes.  No difficulty with her speech.  No difficulty with her balance.  No numbness or weakness to her extremities.  She went to an [place] walk-in clinic today and was told to come here for a CT scan of her head.  Of note, she does have a history of hypertension.  Per chart review, it seems like she has been noncompliant with medications.  The last office visit I can see is from 2020.  She was started on losartan at that time after she had not tolerated several other medications.  Is unclear how long she took that.  She says about 6 months to a year ago, a PA at her primary care office doubled her  antihypertensive medication and she said it made her feel really bad.  At that time she stopped her blood pressure medication and has not restarted anything.  She says she has not been back to the office since then.       Home Medications Prior to Admission medications   Medication Sig Start Date End Date Taking? Authorizing Provider  meclizine (ANTIVERT) 12.5 MG tablet Take 1 tablet (12.5 mg total) by mouth 3 (three) times daily as needed for dizziness. 08/28/21  Yes Malvin Johns, MD  dorzolamide (TRUSOPT) 2 % ophthalmic solution Place 1 drop into both eyes 3 (three) times daily.    [provider]  DORZOLAMIDE HCL-TIMOLOL MAL OP Apply 1 drop to eye daily.    [provider]  hydrochlorothiazide (HYDRODIURIL) 12.5 MG tablet Take 1 tablet (12.5 mg total) by mouth daily. 03/15/14   Midge Minium, MD  HYDROcodone-acetaminophen (NORCO/VICODIN) 5-325 MG per tablet Take 1 tablet by mouth every 4 (four) hours as needed. 12/26/13   Dorie Rank, MD      Allergies    Allopurinol    Review of Systems   Review of Systems  Constitutional:  Negative for chills, diaphoresis, fatigue and fever.  HENT:  Negative for congestion, rhinorrhea and  sneezing.   Eyes: Negative.   Respiratory:  Negative for cough, chest tightness and shortness of breath.   Cardiovascular:  Negative for chest pain and leg swelling.  Gastrointestinal:  Positive for nausea and vomiting. Negative for abdominal pain, blood in stool and diarrhea.  Genitourinary:  Negative for difficulty urinating, flank pain, frequency and hematuria.  Musculoskeletal:  Negative for arthralgias and back pain.  Skin:  Negative for rash.  Neurological:  Positive for dizziness and headaches. Negative for speech difficulty, weakness and numbness.   Physical Exam Updated Vital Signs BP (!) 173/82   Pulse 67   Temp 98.3 F (36.8 C) (Oral)   Resp (!) 27   Ht 5\' 4"  (1.626 m)   Wt 93.6 kg   SpO2 100%   BMI 35.42 kg/m   Physical Exam Constitutional:      Appearance: She is well-developed.  HENT:     Head: Normocephalic and atraumatic.  Eyes:     Extraocular Movements: Extraocular movements intact.     Pupils: Pupils are equal, round, and reactive to light.     Comments: No nystagmus, visual fields not tested due to her underlying vision deficits  Cardiovascular:     Rate and Rhythm: Normal rate and regular rhythm.     Heart sounds: Normal heart sounds.  Pulmonary:     Effort: Pulmonary effort is normal. No respiratory distress.     Breath sounds: Normal breath sounds. No wheezing or rales.  Chest:     Chest wall: No tenderness.  Abdominal:     General: Bowel sounds are normal.     Palpations: Abdomen is soft.     Tenderness: There is no abdominal tenderness. There is no guarding or rebound.  Musculoskeletal:        General: Normal range of motion.     Cervical back: Normal range of motion and neck supple.  Lymphadenopathy:     Cervical: No cervical adenopathy.  Skin:    General: Skin is warm and dry.     Findings: No rash.  Neurological:     Mental Status: She is alert and oriented to person, place, and time.     Comments: Motor 5 out of 5 all extremities, sensation grossly intact to light touch all extremities, finger-nose intact, no pronator drift, she has normal sensation in the nerve distributions of the face.  No facial drooping.  Normal speech.    ED Results / Procedures / Treatments   Labs (all labs ordered are listed, but only abnormal results are displayed) Labs Reviewed  URINALYSIS, ROUTINE W REFLEX MICROSCOPIC - Abnormal; Notable for the following components:      Result Value   APPearance HAZY (*)    Hgb urine dipstick SMALL (*)    Protein, ur 100 (*)    Leukocytes,Ua SMALL (*)    All other components within normal limits  BASIC METABOLIC PANEL - Abnormal; Notable for the following components:   Potassium 3.4 (*)    Glucose, Bld 123 (*)    All other components within  normal limits  CBC WITH DIFFERENTIAL/PLATELET - Abnormal; Notable for the following components:   Platelets 134 (*)    All other components within normal limits  URINALYSIS, MICROSCOPIC (REFLEX) - Abnormal; Notable for the following components:   Bacteria, UA MANY (*)    All other components within normal limits  URINE CULTURE    EKG EKG Interpretation  Date/Time:  Friday August 28 2021 18:03:16 EDT Ventricular Rate:  72 PR Interval:  196 QRS Duration: 88 QT Interval:  383 QTC Calculation: 420 R Axis:   -50 Text Interpretation: Sinus rhythm Left anterior fascicular block Abnormal R-wave progression, late transition Left ventricular hypertrophy No old tracing to compare Confirmed by Malvin Johns [phone]) on 08/28/2021 6:07:11 PM  Radiology CT Head Wo Contrast  Result Date: 08/28/2021 CLINICAL DATA:  Neuro deficit, acute, stroke suspected EXAM: CT HEAD WITHOUT CONTRAST TECHNIQUE: Contiguous axial images were obtained from the base of the skull through the vertex without intravenous contrast. RADIATION DOSE REDUCTION: This exam was performed according to the departmental dose-optimization program which includes automated exposure control, adjustment of the mA and/or kV according to patient size and/or use of iterative reconstruction technique. COMPARISON:  None Available. BRAIN: BRAIN Cerebral ventricle sizes are concordant with the degree of cerebral volume loss. Patchy and confluent areas of decreased attenuation are noted throughout the deep and periventricular white matter of the cerebral hemispheres bilaterally, compatible with chronic microvascular ischemic disease. No evidence of large-territorial acute infarction. No parenchymal hemorrhage. No mass lesion. No extra-axial collection. No mass effect or midline shift. No hydrocephalus. Basilar cisterns are patent. Vascular: No hyperdense vessel. Skull: No acute fracture or focal lesion. Sinuses/Orbits: Paranasal sinuses and mastoid air cells  are clear. Left lens replacement. The orbits are unremarkable. Other: None. IMPRESSION: No acute intracranial abnormality. Electronically Signed   By: Iven Finn M.D.   On: 08/28/2021 18:54    Procedures Procedures    Medications Ordered in ED Medications  sodium chloride 0.9 % bolus 500 mL (500 mLs Intravenous New Bag/Given 08/28/21 1835)    ED Course/ Medical Decision Making/ A&P                           Medical Decision Making Problems Addressed: Dizziness: undiagnosed new problem with uncertain prognosis Hypertension, unspecified type: undiagnosed new problem with uncertain prognosis  Amount and/or Complexity of Data Reviewed External Data Reviewed: notes. Labs: ordered. Decision-making details documented in ED Course. Radiology: ordered. Decision-making details documented in ED Course. ECG/medicine tests: ordered and independent interpretation performed. Decision-making details documented in ED Course.  Risk Prescription drug management. Decision regarding hospitalization.   Patient is a 87 year old female who presents with dizziness.  It started about a week ago but had been getting markedly better.  She does not have any focal neurologic deficits or suggestions of a stroke.  She now only has dizziness if she bends over or stands up for too long.  She currently does not have any nystagmus or symptoms so HINTS exam is not done.  I suspect that she has a component of peripheral vertigo.  She does not have symptoms that sound more concerning for central etiology.  She had labs checked which are nonconcerning.  Her urine had some bacteria.  Its not overwhelming for infection.  She currently does not really have any symptoms other than the dizziness.  Potentially this could be causing some symptoms but I suspect since her dizziness has been improving that it is unlikely to be associated with a UTI.  It was sent for culture.  She had a head CT which shows no acute abnormality.  Her  blood pressure was markedly elevated on arrival but on recheck prior to discharge, was 137/90.  At this point I will not start her back on blood pressure medicine given that.  She was able to ambulate with a walker.  She did not have any dizziness or ataxia that was noticeable  with her ambulation with a walker.  Given this, I do not feel at this point she needs to be transferred for an MRI or admission to the hospital.  She was discharged home in good condition.  She was encouraged to have close follow-up with her PCP.  She is trying to get reestablished with a primary care doctor that is moved to [place].  She was encouraged to call on Monday for close appointment.  Return precautions were given.  She was given prescription for meclizine.  Final Clinical Impression(s) / ED Diagnoses Final diagnoses:  Dizziness  Hypertension, unspecified type    Rx / DC Orders ED Discharge Orders          Ordered    meclizine (ANTIVERT) 12.5 MG tablet  3 times daily PRN        08/28/21 2002              Malvin Johns, MD 08/28/21 2005    Malvin Johns, MD 08/28/21 2007

## 2021-08-28 NOTE — ED Notes (Signed)
Patient ambulated with walker with no sign of distress. Patient denies dizziness at this time.

## 2021-08-28 NOTE — Discharge Instructions (Addendum)
Call on Monday to follow-up with your primary care doctor this week to have your blood pressure rechecked and to reassess your symptoms.  Return to the emergency room if you have any worsening symptoms.

## 2021-08-28 NOTE — ED Notes (Signed)
Patient transported to CT 

## 2021-08-28 NOTE — ED Triage Notes (Signed)
Pt sent by PCP for dizziness x 1 week and headaches  NAD, denies any pain at this time Uses walker at home, gait at baseline

## 2021-08-31 LAB — URINE CULTURE: Culture: 60000 — AB

## 2021-09-01 ENCOUNTER — Telehealth: Payer: Self-pay

## 2021-09-01 NOTE — Progress Notes (Signed)
ED Antimicrobial Stewardship Positive Culture Follow Up   Hayley Wilson is an 87 y.o. female who presented to [facility] on 08/28/2021 with a chief complaint of  Chief Complaint  Patient presents with   Dizziness    Recent Results (from the past 720 hour(s))  Urine Culture     Status: Abnormal   Collection Time: 08/28/21  6:01 PM   Specimen: Urine, Clean Catch  Result Value Ref Range Status   Specimen Description   Final    URINE, CLEAN CATCH Performed at [facility], [address] [place], [place] [address]    Special Requests   Final    NONE Performed at [facility], [address] [place] [employer] [place], [place] [address]    Culture 60,000 COLONIES/mL ENTEROBACTER CLOACAE (A)  Final   Report Status 08/31/2021 FINAL  Final   Organism ID, Bacteria ENTEROBACTER CLOACAE (A)  Final      Susceptibility   Enterobacter cloacae - MIC*    CEFAZOLIN RESISTANT Resistant     CEFEPIME <=0.12 SENSITIVE Sensitive     CIPROFLOXACIN <=0.25 SENSITIVE Sensitive     GENTAMICIN <=1 SENSITIVE Sensitive     IMIPENEM <=0.25 SENSITIVE Sensitive     NITROFURANTOIN <=16 SENSITIVE Sensitive     TRIMETH/SULFA <=20 SENSITIVE Sensitive     PIP/TAZO <=4 SENSITIVE Sensitive     * 60,000 COLONIES/mL ENTEROBACTER CLOACAE    Patient discharged originally without antimicrobial agent. Please contact patient and follow up for a symptom check. If pt remains asymptomatic and dizziness has resolved, no further treatment necessary. If pt continues to have dizziness or now has any signs or symptoms of a UTI (i.e. increased urinary frequency, dysuria, fever) treatment is now indicated. Please start nitrofurantoin 100mg  by mouth every 12 hours for 5 days.   New antibiotic prescription: nitrofurantoin 100mg  by mouth every 12 hours for 5 days  ED Provider: , PA-C   09/01/2021, 2:06 PM Clinical Pharmacist Monday - Friday phone -  [phone] Saturday - Sunday phone -  [phone]

## 2021-09-01 NOTE — Telephone Encounter (Signed)
Post ED Visit - Positive Culture Follow-up: Unsuccessful Patient Follow-up  Culture assessed and recommendations reviewed by:  [x]  Luisa Hart, Pharm.D. []  Heide Guile, Pharm.D., BCPS AQ-ID []  Parks Neptune, Pharm.D., BCPS []  Alycia Rossetti, Pharm.D., BCPS []  [place], [place].D., BCPS, AAHIVP []  Legrand Como, Pharm.D., BCPS, AAHIVP []  Wynell Balloon, PharmD []  Vincenza Hews, PharmD, BCPS  Positive urine culture  Plan: call patient if continuing to have dizziness or other symptoms of UTI start Nitrofurantoin 100 mg po BID x  5 days per ED provider Debbe Mounts, PA-C  [x]  Patient discharged without antimicrobial prescription and treatment is now indicated []  Organism is resistant to prescribed ED discharge antimicrobial []  Patient with positive blood cultures   Unable to contact patient after multiple attempts, letter will be sent to address on file  Hayley Wilson 09/01/2021, 2:33 PM

## 2021-09-22 ENCOUNTER — Telehealth: Payer: Self-pay | Admitting: Family Medicine

## 2021-09-22 ENCOUNTER — Telehealth: Payer: Self-pay

## 2021-10-01 NOTE — Telephone Encounter (Signed)
Attempted to reach pt . No VM and no answer at this time . If the pt calls back pt needs to check to see if DR Beverely Low will be accepting any new pts or she can reach out to [place] at [facility]

## 2021-12-04 NOTE — Telephone Encounter (Signed)
error 

## 2023-01-23 ENCOUNTER — Other Ambulatory Visit: Payer: Self-pay

## 2023-01-23 ENCOUNTER — Encounter (HOSPITAL_BASED_OUTPATIENT_CLINIC_OR_DEPARTMENT_OTHER): Payer: Self-pay

## 2023-01-23 ENCOUNTER — Inpatient Hospital Stay (HOSPITAL_BASED_OUTPATIENT_CLINIC_OR_DEPARTMENT_OTHER)
Admission: EM | Admit: 2023-01-23 | Discharge: 2023-01-30 | DRG: 871 | Disposition: A | Discharge status: Skilled Nursing Facility | Payer: Medicare (Managed Care) | Attending: Internal Medicine | Admitting: Internal Medicine

## 2023-01-23 ENCOUNTER — Emergency Department (HOSPITAL_BASED_OUTPATIENT_CLINIC_OR_DEPARTMENT_OTHER): Payer: Medicare (Managed Care)

## 2023-01-23 DIAGNOSIS — R591 Generalized enlarged lymph nodes: Secondary | ICD-10-CM | POA: Diagnosis present

## 2023-01-23 DIAGNOSIS — Z888 Allergy status to other drugs, medicaments and biological substances status: Secondary | ICD-10-CM

## 2023-01-23 DIAGNOSIS — I480 Paroxysmal atrial fibrillation: Secondary | ICD-10-CM | POA: Diagnosis present

## 2023-01-23 DIAGNOSIS — E538 Deficiency of other specified B group vitamins: Secondary | ICD-10-CM | POA: Diagnosis present

## 2023-01-23 DIAGNOSIS — N179 Acute kidney failure, unspecified: Secondary | ICD-10-CM

## 2023-01-23 DIAGNOSIS — M4854XA Collapsed vertebra, not elsewhere classified, thoracic region, initial encounter for fracture: Secondary | ICD-10-CM | POA: Diagnosis present

## 2023-01-23 DIAGNOSIS — C787 Secondary malignant neoplasm of liver and intrahepatic bile duct: Secondary | ICD-10-CM | POA: Diagnosis present

## 2023-01-23 DIAGNOSIS — R64 Cachexia: Secondary | ICD-10-CM | POA: Diagnosis present

## 2023-01-23 DIAGNOSIS — Z87440 Personal history of urinary (tract) infections: Secondary | ICD-10-CM

## 2023-01-23 DIAGNOSIS — J45909 Unspecified asthma, uncomplicated: Secondary | ICD-10-CM | POA: Diagnosis present

## 2023-01-23 DIAGNOSIS — E860 Dehydration: Secondary | ICD-10-CM | POA: Diagnosis present

## 2023-01-23 DIAGNOSIS — R6520 Severe sepsis without septic shock: Secondary | ICD-10-CM | POA: Diagnosis present

## 2023-01-23 DIAGNOSIS — N170 Acute kidney failure with tubular necrosis: Secondary | ICD-10-CM | POA: Diagnosis present

## 2023-01-23 DIAGNOSIS — R531 Weakness: Secondary | ICD-10-CM | POA: Diagnosis not present

## 2023-01-23 DIAGNOSIS — C679 Malignant neoplasm of bladder, unspecified: Secondary | ICD-10-CM | POA: Diagnosis present

## 2023-01-23 DIAGNOSIS — Z7189 Other specified counseling: Secondary | ICD-10-CM | POA: Diagnosis not present

## 2023-01-23 DIAGNOSIS — I10 Essential (primary) hypertension: Secondary | ICD-10-CM | POA: Diagnosis present

## 2023-01-23 DIAGNOSIS — A419 Sepsis, unspecified organism: Principal | ICD-10-CM

## 2023-01-23 DIAGNOSIS — C7951 Secondary malignant neoplasm of bone: Secondary | ICD-10-CM | POA: Diagnosis present

## 2023-01-23 DIAGNOSIS — I4891 Unspecified atrial fibrillation: Secondary | ICD-10-CM | POA: Diagnosis not present

## 2023-01-23 DIAGNOSIS — N390 Urinary tract infection, site not specified: Secondary | ICD-10-CM | POA: Diagnosis present

## 2023-01-23 DIAGNOSIS — D62 Acute posthemorrhagic anemia: Secondary | ICD-10-CM | POA: Diagnosis not present

## 2023-01-23 DIAGNOSIS — E43 Unspecified severe protein-calorie malnutrition: Secondary | ICD-10-CM | POA: Diagnosis present

## 2023-01-23 DIAGNOSIS — R319 Hematuria, unspecified: Secondary | ICD-10-CM | POA: Diagnosis not present

## 2023-01-23 DIAGNOSIS — R54 Age-related physical debility: Secondary | ICD-10-CM | POA: Diagnosis present

## 2023-01-23 DIAGNOSIS — Z515 Encounter for palliative care: Secondary | ICD-10-CM

## 2023-01-23 DIAGNOSIS — R627 Adult failure to thrive: Secondary | ICD-10-CM | POA: Diagnosis present

## 2023-01-23 DIAGNOSIS — C799 Secondary malignant neoplasm of unspecified site: Secondary | ICD-10-CM | POA: Diagnosis not present

## 2023-01-23 DIAGNOSIS — C7982 Secondary malignant neoplasm of genital organs: Secondary | ICD-10-CM | POA: Diagnosis present

## 2023-01-23 DIAGNOSIS — E876 Hypokalemia: Secondary | ICD-10-CM | POA: Diagnosis present

## 2023-01-23 DIAGNOSIS — Z6826 Body mass index (BMI) 26.0-26.9, adult: Secondary | ICD-10-CM

## 2023-01-23 DIAGNOSIS — C55 Malignant neoplasm of uterus, part unspecified: Secondary | ICD-10-CM

## 2023-01-23 DIAGNOSIS — E440 Moderate protein-calorie malnutrition: Secondary | ICD-10-CM | POA: Diagnosis not present

## 2023-01-23 DIAGNOSIS — R1310 Dysphagia, unspecified: Secondary | ICD-10-CM | POA: Diagnosis present

## 2023-01-23 DIAGNOSIS — Z79899 Other long term (current) drug therapy: Secondary | ICD-10-CM

## 2023-01-23 DIAGNOSIS — K921 Melena: Secondary | ICD-10-CM | POA: Diagnosis not present

## 2023-01-23 DIAGNOSIS — Z635 Disruption of family by separation and divorce: Secondary | ICD-10-CM

## 2023-01-23 DIAGNOSIS — C7800 Secondary malignant neoplasm of unspecified lung: Secondary | ICD-10-CM | POA: Diagnosis present

## 2023-01-23 DIAGNOSIS — C579 Malignant neoplasm of female genital organ, unspecified: Secondary | ICD-10-CM | POA: Diagnosis not present

## 2023-01-23 DIAGNOSIS — R262 Difficulty in walking, not elsewhere classified: Secondary | ICD-10-CM | POA: Diagnosis present

## 2023-01-23 HISTORY — DX: Malignant (primary) neoplasm, unspecified: C80.1

## 2023-01-23 LAB — CBC WITH DIFFERENTIAL/PLATELET
Abs Immature Granulocytes: 0.15 10*3/uL — ABNORMAL HIGH (ref 0.00–0.07)
Basophils Absolute: 0 10*3/uL (ref 0.0–0.1)
Basophils Relative: 0 %
Eosinophils Absolute: 0 10*3/uL (ref 0.0–0.5)
Eosinophils Relative: 0 %
HCT: 40.2 % (ref 36.0–46.0)
Hemoglobin: 12.4 g/dL (ref 12.0–15.0)
Immature Granulocytes: 1 %
Lymphocytes Relative: 4 %
Lymphs Abs: 1 10*3/uL (ref 0.7–4.0)
MCH: 26.1 pg (ref 26.0–34.0)
MCHC: 30.8 g/dL (ref 30.0–36.0)
MCV: 84.6 fL (ref 80.0–100.0)
Monocytes Absolute: 1.4 10*3/uL — ABNORMAL HIGH (ref 0.1–1.0)
Monocytes Relative: 5 %
Neutro Abs: 23.7 10*3/uL — ABNORMAL HIGH (ref 1.7–7.7)
Neutrophils Relative %: 90 %
Platelets: 265 10*3/uL (ref 150–400)
RBC: 4.75 MIL/uL (ref 3.87–5.11)
RDW: 16.4 % — ABNORMAL HIGH (ref 11.5–15.5)
WBC: 26.3 10*3/uL — ABNORMAL HIGH (ref 4.0–10.5)
nRBC: 0 % (ref 0.0–0.2)

## 2023-01-23 LAB — URINALYSIS, MICROSCOPIC (REFLEX)

## 2023-01-23 LAB — URINALYSIS, ROUTINE W REFLEX MICROSCOPIC
Glucose, UA: NEGATIVE mg/dL
Ketones, ur: 15 mg/dL — AB
Leukocytes,Ua: NEGATIVE
Nitrite: POSITIVE — AB
Protein, ur: 30 mg/dL — AB
Specific Gravity, Urine: >=1.03 (ref 1.005–1.030)
pH: 5 (ref 5.0–8.0)

## 2023-01-23 LAB — LACTIC ACID, PLASMA
Lactic Acid, Venous: 1.5 mmol/L (ref 0.5–1.9)
Lactic Acid, Venous: 1.9 mmol/L (ref 0.5–1.9)

## 2023-01-23 LAB — COMPREHENSIVE METABOLIC PANEL
ALT: 15 U/L (ref 0–44)
AST: 31 U/L (ref 15–41)
Albumin: 2.7 g/dL — ABNORMAL LOW (ref 3.5–5.0)
Alkaline Phosphatase: 95 U/L (ref 38–126)
Anion gap: 20 — ABNORMAL HIGH (ref 5–15)
BUN: 32 mg/dL — ABNORMAL HIGH (ref 8–23)
CO2: 25 mmol/L (ref 22–32)
Calcium: 10 mg/dL (ref 8.9–10.3)
Chloride: 94 mmol/L — ABNORMAL LOW (ref 98–111)
Creatinine, Ser: 1.3 mg/dL — ABNORMAL HIGH (ref 0.44–1.00)
GFR, Estimated: 40 mL/min — ABNORMAL LOW (ref >60)
Glucose, Bld: 109 mg/dL — ABNORMAL HIGH (ref 70–99)
Potassium: 4 mmol/L (ref 3.5–5.1)
Sodium: 139 mmol/L (ref 135–145)
Total Bilirubin: 1.2 mg/dL (ref 0.3–1.2)
Total Protein: 8 g/dL (ref 6.5–8.1)

## 2023-01-23 LAB — CBG MONITORING, ED: Glucose-Capillary: 95 mg/dL (ref 70–99)

## 2023-01-23 MED ORDER — ALBUTEROL SULFATE (2.5 MG/3ML) 0.083% IN NEBU: 2.5000 mg | INHALATION_SOLUTION | RESPIRATORY_TRACT | Status: DC | PRN

## 2023-01-23 MED ORDER — LACTATED RINGERS IV BOLUS (SEPSIS)
1000.0000 mL | Freq: Once | INTRAVENOUS | Status: AC
  Administered 2023-01-23: 1000 mL via INTRAVENOUS

## 2023-01-23 MED ORDER — TRAZODONE HCL 50 MG PO TABS
25.0000 mg | ORAL_TABLET | Freq: Every evening | ORAL | Status: DC | PRN
  Administered 2023-01-25 – 2023-01-26 (×2): 25 mg via ORAL
  Filled 2023-01-23 (×2): qty 1

## 2023-01-23 MED ORDER — SODIUM CHLORIDE 0.9 % IV SOLN
2.0000 g | INTRAVENOUS | Status: AC
  Administered 2023-01-23 – 2023-01-29 (×7): 2 g via INTRAVENOUS
  Filled 2023-01-23 (×7): qty 20

## 2023-01-23 MED ORDER — LACTATED RINGERS IV BOLUS (SEPSIS): 250.0000 mL | Freq: Once | INTRAVENOUS | Status: DC

## 2023-01-23 MED ORDER — ACETAMINOPHEN 650 MG RE SUPP: 650.0000 mg | Freq: Four times a day (QID) | RECTAL | Status: DC | PRN

## 2023-01-23 MED ORDER — SODIUM CHLORIDE 0.9 % IV BOLUS
1000.0000 mL | Freq: Once | INTRAVENOUS | Status: AC
  Administered 2023-01-23: 1000 mL via INTRAVENOUS

## 2023-01-23 MED ORDER — SODIUM CHLORIDE 0.9 % IV SOLN: 1.0000 g | INTRAVENOUS | Status: DC

## 2023-01-23 MED ORDER — ACETAMINOPHEN 325 MG PO TABS: 650.0000 mg | ORAL_TABLET | Freq: Four times a day (QID) | ORAL | Status: DC | PRN

## 2023-01-23 MED ORDER — ONDANSETRON HCL 4 MG/2ML IJ SOLN
4.0000 mg | Freq: Four times a day (QID) | INTRAMUSCULAR | Status: DC | PRN
  Administered 2023-01-29: 4 mg via INTRAVENOUS
  Filled 2023-01-23: qty 2

## 2023-01-23 MED ORDER — ONDANSETRON HCL 4 MG PO TABS: 4.0000 mg | ORAL_TABLET | Freq: Four times a day (QID) | ORAL | Status: DC | PRN

## 2023-01-23 MED ORDER — HYDRALAZINE HCL 20 MG/ML IJ SOLN: 5.0000 mg | Freq: Four times a day (QID) | INTRAMUSCULAR | Status: DC | PRN

## 2023-01-23 MED ORDER — ENSURE ENLIVE PO LIQD
237.0000 mL | Freq: Three times a day (TID) | ORAL | Status: DC
  Administered 2023-01-23 – 2023-01-30 (×10): 237 mL via ORAL

## 2023-01-23 MED ORDER — HEPARIN SODIUM (PORCINE) 5000 UNIT/ML IJ SOLN
5000.0000 [IU] | Freq: Three times a day (TID) | INTRAMUSCULAR | Status: DC
  Administered 2023-01-23 – 2023-01-28 (×13): 5000 [IU] via SUBCUTANEOUS
  Filled 2023-01-23 (×15): qty 1

## 2023-01-23 NOTE — ED Notes (Signed)
Attempted to call report to 4E; staff answering call said they cannot take pt in assigned room; explained that [facility] is en route to [facility]; bed then changed to 1437; call was not transferred; will attempt to call back.

## 2023-01-23 NOTE — Plan of Care (Signed)
  Problem: Elimination: Goal: Will not experience complications related to urinary retention Outcome: Progressing   Problem: Pain Management: Goal: General experience of comfort will improve Outcome: Progressing   Problem: Skin Integrity: Goal: Risk for impaired skin integrity will decrease Outcome: Progressing

## 2023-01-23 NOTE — Plan of Care (Signed)
Plan of Care Note for accepted transfer  Patient: Hayley Wilson    ZOX:[id number]  DOA: 01/23/2023      Facility requesting transfer: [facility] Requesting Provider: Dr. Jacalyn Lefevre  Reason for transfer: Sepsis, acute kidney injury, UTI, severe weakness, newly diagnosed malignancy (?  Bladder CA, metastatic to liver lungs and bones (incidentally found on CT 2 weeks ago, not started on any treatment)  Facility course:  Patient presented to med [facility] with severe weakness, dehydration, unable to ambulate, lives at home with her grandson who is 63 years old and unable to care for for her.  Labs showed BUN 32, creatinine 1.3, WBCs 26.3. (Creatinine 0.76, WBCs 12.0 on 01/05/2023 when patient was seen at [facility] ED) CT abdomen at [facility] ED on 01/05/2023 for abdominal pain had shown malignancy likely bladder with metastasis to lungs, liver, mildly enlarged retroperitoneal and periaortic lymph nodes compatible with metastatic disease, ?bony metastasis to left femoral shaft).  Patient has not seen any urology or oncology in the interim.    Plan of care: The patient is accepted for admission to Progressive unit, at [facility]    Author: Thad Ranger, MD  01/23/2023  Check [email or web address] for on-call coverage.

## 2023-01-23 NOTE — ED Provider Notes (Signed)
[place] [facility] DE[facility]MENT AT [facility] Provider Note   CSN: [id number] Arrival date & time: 01/23/23  8295     History  Chief Complaint  Patient presents with   Weakness    Hayley Wilson is a 88 y.o. female.  Pt is a 88 yo female with pmhx significant for asthma, htn, and a recent dx of metastatic bladder cancer.  Pt went to the ED in [place] on 10/9 for a uti.  They did a CT while there and she was found to have bladder cancer with mets to the lungs and throughout her abdomen.  Pt was d/c home and has gotten progressively weak.  Pt's grandson lives with her and has not been caring for her well.  EMS was called to her home today and she was found sitting in a brief soiled with stool and urine.  Pt said she's not been eating/drinking.  She has lost weight (26 lbs since last weight here).  Pt was referred to oncology, but has not yet seen them.       Home Medications Prior to Admission medications   Medication Sig Start Date End Date Taking? Authorizing Provider  dorzolamide (TRUSOPT) 2 % ophthalmic solution Place 1 drop into both eyes 3 (three) times daily.    [provider]  DORZOLAMIDE HCL-TIMOLOL MAL OP Apply 1 drop to eye daily.    [provider]  hydrochlorothiazide (HYDRODIURIL) 12.5 MG tablet Take 1 tablet (12.5 mg total) by mouth daily. 03/15/14   Sheliah Hatch, MD  HYDROcodone-acetaminophen (NORCO/VICODIN) 5-325 MG per tablet Take 1 tablet by mouth every 4 (four) hours as needed. 12/26/13   Linwood Dibbles, MD  meclizine (ANTIVERT) 12.5 MG tablet Take 1 tablet (12.5 mg total) by mouth 3 (three) times daily as needed for dizziness. 08/28/21   Rolan Bucco, MD      Allergies    Allopurinol    Review of Systems   Review of Systems  Neurological:  Positive for weakness.  All other systems reviewed and are negative.   Physical Exam Updated Vital Signs BP (!) 153/102   Pulse (!) 119   Temp 97.9 F (36.6 C) (Oral)   Resp 18    Ht 5\' 4"  (1.626 m)   Wt 69 kg   SpO2 98%   BMI 26.11 kg/m  Physical Exam Vitals and nursing note reviewed.  Constitutional:      Appearance: She is ill-appearing.  HENT:     Right Ear: External ear normal.     Left Ear: External ear normal.     Nose: Nose normal.     Mouth/Throat:     Mouth: Mucous membranes are dry.  Eyes:     Extraocular Movements: Extraocular movements intact.     Conjunctiva/sclera: Conjunctivae normal.     Pupils: Pupils are equal, round, and reactive to light.  Cardiovascular:     Rate and Rhythm: Regular rhythm. Tachycardia present.     Pulses: Normal pulses.     Heart sounds: Normal heart sounds.  Pulmonary:     Effort: Pulmonary effort is normal.     Breath sounds: Normal breath sounds.  Abdominal:     General: Abdomen is flat. Bowel sounds are normal.     Palpations: Abdomen is soft.  Musculoskeletal:        General: Normal range of motion.     Cervical back: Normal range of motion and neck supple.  Skin:    General: Skin is warm.  Capillary Refill: Capillary refill takes less than 2 seconds.  Neurological:     General: No focal deficit present.     Mental Status: She is alert and oriented to person, place, and time.  Psychiatric:        Mood and Affect: Mood normal.        Behavior: Behavior normal.     ED Results / Procedures / Treatments   Labs (all labs ordered are listed, but only abnormal results are displayed) Labs Reviewed  CBC WITH DIFFERENTIAL/PLATELET - Abnormal; Notable for the following components:      Result Value   WBC 26.3 (*)    RDW 16.4 (*)    All other components within normal limits  URINALYSIS, ROUTINE W REFLEX MICROSCOPIC - Abnormal; Notable for the following components:   Color, Urine AMBER (*)    APPearance CLOUDY (*)    Hgb urine dipstick SMALL (*)    Bilirubin Urine LARGE (*)    Ketones, ur 15 (*)    Protein, ur 30 (*)    Nitrite POSITIVE (*)    All other components within normal limits   COMPREHENSIVE METABOLIC PANEL - Abnormal; Notable for the following components:   Chloride 94 (*)    Glucose, Bld 109 (*)    BUN 32 (*)    Creatinine, Ser 1.30 (*)    Albumin 2.7 (*)    GFR, Estimated 40 (*)    Anion gap 20 (*)    All other components within normal limits  URINALYSIS, MICROSCOPIC (REFLEX) - Abnormal; Notable for the following components:   Bacteria, UA MANY (*)    Non Squamous Epithelial PRESENT (*)    All other components within normal limits  CULTURE, BLOOD (ROUTINE X 2)  CULTURE, BLOOD (ROUTINE X 2)  URINE CULTURE  LACTIC ACID, PLASMA  LACTIC ACID, PLASMA  CBG MONITORING, ED    EKG EKG Interpretation Date/Time:  Sunday January 23 2023 10:07:02 EDT Ventricular Rate:  108 PR Interval:  144 QRS Duration:  81 QT Interval:  320 QTC Calculation: 429 R Axis:   -62  Text Interpretation: Sinus tachycardia Left atrial enlargement Left anterior fascicular block Abnormal R-wave progression, early transition Left ventricular hypertrophy Anterior Q waves, possibly due to LVH Since last tracing rate faster Confirmed by Jacalyn Lefevre [phone]) on 01/23/2023 10:11:17 AM  Radiology DG Chest Portable 1 View  Result Date: 01/23/2023 CLINICAL DATA:  Altered mental status, weakness. EXAM: PORTABLE CHEST 1 VIEW COMPARISON:  None Available. FINDINGS: The heart size and mediastinal contours are within normal limits. Both lungs are clear. Degenerative changes are seen in the spine and both shoulders. IMPRESSION: No active disease. Electronically Signed   By: Romona Curls M.D.   On: 01/23/2023 11:02    Procedures Procedures    Medications Ordered in ED Medications  lactated ringers bolus 1,000 mL (1,000 mLs Intravenous New Bag/Given 01/23/23 1208)    And  lactated ringers bolus 1,000 mL (has no administration in time range)    And  lactated ringers bolus 250 mL (has no administration in time range)  cefTRIAXone (ROCEPHIN) 2 g in sodium chloride 0.9 % 100 mL IVPB (0 g  Intravenous Stopped 01/23/23 1133)  sodium chloride 0.9 % bolus 1,000 mL (0 mLs Intravenous Stopped 01/23/23 1205)    ED Course/ Medical Decision Making/ A&P  Medical Decision Making Amount and/or Complexity of Data Reviewed Labs: ordered. Radiology: ordered.  Risk Decision regarding hospitalization.   This patient presents to the ED for concern of weakness, this involves an extensive number of treatment options, and is a complaint that carries with it a high risk of complications and morbidity.  The differential diagnosis includes cancer, dehydration, electrolyte abn, infection   Co morbidities that complicate the patient evaluation  asthma, htn, and a recent dx of metastatic bladder cancer   Additional history obtained:  Additional history obtained from epic chart review External records from outside source obtained and reviewed including EMS report   Lab Tests:  I Ordered, and personally interpreted labs.  The pertinent results include:  cbc with wbc elevated at 26.3, ua + for uti + ketones; cmp with bun 32 and cr 1.3, lactic nl   Imaging Studies ordered:  I ordered imaging studies including cxr  I independently visualized and interpreted imaging which showed No active disease.  I agree with the radiologist interpretation   Cardiac Monitoring:  The patient was maintained on a cardiac monitor.  I personally viewed and interpreted the cardiac monitored which showed an underlying rhythm of: st   Medicines ordered and prescription drug management:  I ordered medication including ivfs  for dehydration  Reevaluation of the patient after these medicines showed that the patient improved I have reviewed the patients home medicines and have made adjustments as needed   Test Considered:  cxr   Critical Interventions:  Ivfs/abx   Consultations Obtained:  I requested consultation with the hospitalist (Dr. Isidoro Donning),  and discussed lab  and imaging findings as well as pertinent plan - she will admit   Problem List / ED Course:  Sepsis:  Pt meets sepsis criteria with HR elevation, RR elevation, WBC elevation, low BP and infection from UTI.  Code sepsis called.  Pt given ivfs and iv rocephin. FTT:  pt will likely need rehab or snf upon d/c.  Pt's grandson is only 44 and unable to care for pt. Metastatic cancer:  likely from bladder.  No tx or eval yet.   Reevaluation:  After the interventions noted above, I reevaluated the patient and found that they have :improved   Social Determinants of Health:  Lives at home   Dispostion:  After consideration of the diagnostic results and the patients response to treatment, I feel that the patent would benefit from admission.  CRITICAL CARE Performed by: Jacalyn Lefevre   Total critical care time: 30 minutes  Critical care time was exclusive of separately billable procedures and treating other patients.  Critical care was necessary to treat or prevent imminent or life-threatening deterioration.  Critical care was time spent personally by me on the following activities: development of treatment plan with patient and/or surrogate as well as nursing, discussions with consultants, evaluation of patient's response to treatment, examination of patient, obtaining history from patient or surrogate, ordering and performing treatments and interventions, ordering and review of laboratory studies, ordering and review of radiographic studies, pulse oximetry and re-evaluation of patient's condition.           Final Clinical Impression(s) / ED Diagnoses Final diagnoses:  Sepsis without acute organ dysfunction, due to unspecified organism (HCC)  FTT (failure to thrive) in adult  Dehydration  Primary cancer of bladder with metastasis to other site [facility])    Rx / DC Orders ED Discharge Orders     None         Carlin Attridge,  Raynelle Fanning, MD 01/23/23 1254

## 2023-01-23 NOTE — H&P (Incomplete)
History and Physical  Hayley Wilson WUJ:[id number] DOB: 1934-06-18 DOA: 01/23/2023  PCP: Oneita Hurt, No   Chief Complaint: ***   HPI: Hayley Wilson is a 88 y.o. female with medical history significant for ***   ED Course: ***  Review of Systems: Please see HPI for pertinent positives and negatives. A complete 10 system review of systems are otherwise negative.  Past Medical History:  Diagnosis Date   Asthma    Cancer (HCC)    Hypertension    Past Surgical History:  Procedure Laterality Date   KNEE SURGERY     ORIF FIBULA FRACTURE      Social History:  reports that she has never smoked. She has never used smokeless tobacco. She reports that she does not drink alcohol and does not use drugs.   Allergies  Allergen Reactions   Allopurinol     Felt terrible all over    Family History  Problem Relation Age of Onset   Arthritis Mother    Arthritis Maternal Aunt    Diabetes Paternal Uncle    Stroke Maternal Grandmother      Prior to Admission medications   Medication Sig Start Date End Date Taking? Authorizing Provider  dorzolamide (TRUSOPT) 2 % ophthalmic solution Place 1 drop into both eyes 3 (three) times daily.    [provider]  DORZOLAMIDE HCL-TIMOLOL MAL OP Apply 1 drop to eye daily.    [provider]  hydrochlorothiazide (HYDRODIURIL) 12.5 MG tablet Take 1 tablet (12.5 mg total) by mouth daily. 03/15/14   Sheliah Hatch, MD  HYDROcodone-acetaminophen (NORCO/VICODIN) 5-325 MG per tablet Take 1 tablet by mouth every 4 (four) hours as needed. 12/26/13   Linwood Dibbles, MD  meclizine (ANTIVERT) 12.5 MG tablet Take 1 tablet (12.5 mg total) by mouth 3 (three) times daily as needed for dizziness. 08/28/21   Rolan Bucco, MD    Physical Exam: BP (!) 136/101 (BP Location: Right Arm)   Pulse (!) 105   Temp 97.7 F (36.5 C) (Oral)   Resp 18   Ht 5\' 4"  (1.626 m)   Wt 69 kg   SpO2 99%   BMI 26.11 kg/m   ***         Labs on Admission:  Basic  Metabolic Panel: Recent Labs  Lab 01/23/23 1020  NA 139  K 4.0  CL 94*  CO2 25  GLUCOSE 109*  BUN 32*  CREATININE 1.30*  CALCIUM 10.0   Liver Function Tests: Recent Labs  Lab 01/23/23 1020  AST 31  ALT 15  ALKPHOS 95  BILITOT 1.2  PROT 8.0  ALBUMIN 2.7*   No results for input(s): "LIPASE", "AMYLASE" in the last 168 hours. No results for input(s): "AMMONIA" in the last 168 hours. CBC: Recent Labs  Lab 01/23/23 1001  WBC 26.3*  NEUTROABS 23.7*  HGB 12.4  HCT 40.2  MCV 84.6  PLT 265   Cardiac Enzymes: No results for input(s): "CKTOTAL", "CKMB", "CKMBINDEX", "TROPONINI" in the last 168 hours.  BNP (last 3 results) No results for input(s): "BNP" in the last 8760 hours.  ProBNP (last 3 results) No results for input(s): "PROBNP" in the last 8760 hours.  CBG: Recent Labs  Lab 01/23/23 1049  GLUCAP 95    Radiological Exams on Admission: DG Chest Portable 1 View  Result Date: 01/23/2023 CLINICAL DATA:  Altered mental status, weakness. EXAM: PORTABLE CHEST 1 VIEW COMPARISON:  None Available. FINDINGS: The heart size and mediastinal contours are within normal limits. Both  lungs are clear. Degenerative changes are seen in the spine and both shoulders. IMPRESSION: No active disease. Electronically Signed   By: Romona Curls M.D.   On: 01/23/2023 11:02    Assessment/Plan Principal Problem:   Sepsis (HCC)   DVT prophylaxis: HepSQ     Code Status: Full Code  Consults called: None  Admission status: ***   Time spent: *** minutes  Amyre Segundo Sharlette Dense MD [facility] Pager [phone]  If 7PM-7AM, please contact night-coverage [email or web address] Password [email or web address]  01/23/2023, 3:12 PM

## 2023-01-23 NOTE — Sepsis Progress Note (Signed)
Elink following code sepsis °

## 2023-01-23 NOTE — ED Notes (Signed)
Pts CBG 95  

## 2023-01-23 NOTE — Progress Notes (Signed)
Patient arrived from [facility] via [facility]. Report not [facility] from [facility].

## 2023-01-23 NOTE — ED Notes (Signed)
Unsuccessful IV attempt X2

## 2023-01-23 NOTE — ED Triage Notes (Addendum)
Pt arrived via EMS from home. EMS reports grandson lives with her. Pt had been independent up until approx 2 weeks when dx with UTI and bladder cancer. Progressive weakness, dry mouth and difficulty swallowing. VSS stable en route. Found by EMS with very soiled brief with loose stool and urine.  CBG 154 per EMS

## 2023-01-23 NOTE — H&P (Addendum)
History and Physical  Hayley Wilson:[id number] DOB: 15-Jan-1935 DOA: 01/23/2023  PCP: Oneita Hurt, No   Chief Complaint: Weakness and dehydration  HPI: Hayley Wilson is a 88 y.o. female with medical history significant for hypertension and recently treated UTI as well as presumed bladder malignancy seen on CT scan 10/9 at [facility] health ER being admitted to the hospital with recurrent UTI, sepsis with acute renal failure.  Patient states she lives with her adopted grandson who is 25 years old and having increasing difficulty taking care of her.  States that since her UTI treatment at the beginning of this month, she has felt improved, denies any dysuria, fevers, but has difficulty swallowing due to sore throat and has continued to lose weight.  She thinks that in the last couple months she is probably lost at least 25 pounds.  She did complete a course of p.o. Keflex as an outpatient after her stay at [facility] health ER.  She was told that she would be contacted by oncology office to schedule outpatient follow-up, but never received contact from them.  ED Course: She presented to [facility] for evaluation where she was tachycardic, hemodynamically stable on room air.  Lab work was done which shows leukocytosis, AKI normal lactate, abnormal urinalysis.  She was started on empiric IV Rocephin and admitted to the hospitalist service at [facility].  Review of Systems: Please see HPI for pertinent positives and negatives. A complete 10 system review of systems are otherwise negative.  Past Medical History:  Diagnosis Date   Asthma    Recently diagnosed presumed bladder malignancy with metastasis     Hypertension    Past Surgical History:  Procedure Laterality Date   KNEE SURGERY     ORIF FIBULA FRACTURE      Social History:  reports that she has never smoked. She has never used smokeless tobacco. She reports that she does not drink alcohol and does not use drugs.   Allergies  Allergen  Reactions   Allopurinol     Felt terrible all over    Family History  Problem Relation Age of Onset   Arthritis Mother    Arthritis Maternal Aunt    Diabetes Paternal Uncle    Stroke Maternal Grandmother      Prior to Admission medications   Medication Sig Start Date End Date Taking? Authorizing Provider  dorzolamide (TRUSOPT) 2 % ophthalmic solution Place 1 drop into both eyes 3 (three) times daily.    [provider]  DORZOLAMIDE HCL-TIMOLOL MAL OP Apply 1 drop to eye daily.    [provider]  hydrochlorothiazide (HYDRODIURIL) 12.5 MG tablet Take 1 tablet (12.5 mg total) by mouth daily. 03/15/14   Sheliah Hatch, MD  HYDROcodone-acetaminophen (NORCO/VICODIN) 5-325 MG per tablet Take 1 tablet by mouth every 4 (four) hours as needed. 12/26/13   Linwood Dibbles, MD  meclizine (ANTIVERT) 12.5 MG tablet Take 1 tablet (12.5 mg total) by mouth 3 (three) times daily as needed for dizziness. 08/28/21   Rolan Bucco, MD    Physical Exam: BP (!) 136/101 (BP Location: Right Arm)   Pulse (!) 105   Temp 97.7 F (36.5 C) (Oral)   Resp 18   Ht 5\' 4"  (1.626 m)   Wt 69 kg   SpO2 99%   BMI 26.11 kg/m   General:  Alert, oriented, calm, in no acute distress, thin elderly female looks nontoxic, but emaciated. Eyes: EOMI, clear conjuctivae, white sclerea Neck: supple, no masses, trachea  mildline  Cardiovascular: RRR, no murmurs or rubs, no peripheral edema  Respiratory: clear to auscultation bilaterally, no wheezes, no crackles  Abdomen: soft, nontender, nondistended, normal bowel tones heard  Skin: dry, no rashes  Musculoskeletal: no joint effusions, normal range of motion  Psychiatric: appropriate affect, normal speech  Neurologic: extraocular muscles intact, clear speech, moving all extremities with intact sensorium         Labs on Admission:  Basic Metabolic Panel: Recent Labs  Lab 01/23/23 1020  NA 139  K 4.0  CL 94*  CO2 25  GLUCOSE 109*  BUN 32*   CREATININE 1.30*  CALCIUM 10.0   Liver Function Tests: Recent Labs  Lab 01/23/23 1020  AST 31  ALT 15  ALKPHOS 95  BILITOT 1.2  PROT 8.0  ALBUMIN 2.7*   No results for input(s): "LIPASE", "AMYLASE" in the last 168 hours. No results for input(s): "AMMONIA" in the last 168 hours. CBC: Recent Labs  Lab 01/23/23 1001  WBC 26.3*  NEUTROABS 23.7*  HGB 12.4  HCT 40.2  MCV 84.6  PLT 265   Cardiac Enzymes: No results for input(s): "CKTOTAL", "CKMB", "CKMBINDEX", "TROPONINI" in the last 168 hours.  BNP (last 3 results) No results for input(s): "BNP" in the last 8760 hours.  ProBNP (last 3 results) No results for input(s): "PROBNP" in the last 8760 hours.  CBG: Recent Labs  Lab 01/23/23 1049  GLUCAP 95    Radiological Exams on Admission: DG Chest Portable 1 View  Result Date: 01/23/2023 CLINICAL DATA:  Altered mental status, weakness. EXAM: PORTABLE CHEST 1 VIEW COMPARISON:  None Available. FINDINGS: The heart size and mediastinal contours are within normal limits. Both lungs are clear. Degenerative changes are seen in the spine and both shoulders. IMPRESSION: No active disease. Electronically Signed   By: Romona Curls M.D.   On: 01/23/2023 11:02    Assessment/Plan Hayley Wilson is a 88 y.o. female with medical history significant for hypertension and recently treated UTI as well as presumed bladder malignancy seen on CT scan 10/9 at [facility] health ER being admitted to the hospital with recurrent UTI, sepsis with acute renal failure.    Sepsis-meeting criteria with tachycardia, leukocytosis, source is UTI.  She has associated AKI. -Inpatient admission to progressive -Follow-up blood and urine cultures obtained in the ER -Treat UTI as below  UTI-with leukocytosis, abnormal urinalysis. -Empiric IV Rocephin, follow-up urine culture  AKI-in the setting of baseline normal renal function, the last labs in our system from 2023.  Likely ATN related to sepsis. -Patient  received IV fluid hydration in the ER -Encourage p.o. intake -Avoid nephrotoxins, and renally dose medications -Follow renal function with daily labs  Bladder mass with evidence of metastasis-with lymphadenopathy, bony and spinal mets -IR consult placed for potential biopsy -Patient would be amenable to oncology consultation at [facility]  Hypertension-holding home medication in the setting of AKI, will use IV hydralazine in the meantime  Dysphagia-with some complaints of throat pain, as well as regurgitation of food -Will start with SLP evaluation -Consider GI evaluation if deemed necessary  [facility] ambulatory dysfunction, likely due to debility from recurrent infection, and more concerningly in the setting of metastatic malignancy -PT/OT -TOC consult, patient may benefit from placement  DVT prophylaxis: Heparin subcu    Code Status: Full Code  Consults called: None  Admission status: The appropriate patient status for this patient is INPATIENT. Inpatient status is judged to be reasonable and necessary in order to provide the  required intensity of service to ensure the patient's safety. The patient's presenting symptoms, physical exam findings, and initial radiographic and laboratory data in the context of their chronic comorbidities is felt to place them at high risk for further clinical deterioration. Furthermore, it is not anticipated that the patient will be medically stable for discharge from the hospital within 2 midnights of admission.    I certify that at the point of admission it is my clinical judgment that the patient will require inpatient hospital care spanning beyond 2 midnights from the point of admission due to high intensity of service, high risk for further deterioration and high frequency of surveillance required  Time spent: 59 minutes  Dionne Knoop Sharlette Dense MD [facility] Pager [phone]  If 7PM-7AM, please contact  night-coverage [email or web address] Password [email or web address]  01/23/2023, 3:41 PM

## 2023-01-24 ENCOUNTER — Inpatient Hospital Stay (HOSPITAL_COMMUNITY): Payer: Medicare (Managed Care)

## 2023-01-24 DIAGNOSIS — N179 Acute kidney failure, unspecified: Secondary | ICD-10-CM | POA: Diagnosis not present

## 2023-01-24 DIAGNOSIS — A419 Sepsis, unspecified organism: Secondary | ICD-10-CM | POA: Diagnosis not present

## 2023-01-24 DIAGNOSIS — N390 Urinary tract infection, site not specified: Secondary | ICD-10-CM | POA: Diagnosis not present

## 2023-01-24 LAB — BASIC METABOLIC PANEL
Anion gap: 10 (ref 5–15)
BUN: 27 mg/dL — ABNORMAL HIGH (ref 8–23)
CO2: 27 mmol/L (ref 22–32)
Calcium: 9 mg/dL (ref 8.9–10.3)
Chloride: 102 mmol/L (ref 98–111)
Creatinine, Ser: 0.83 mg/dL (ref 0.44–1.00)
GFR, Estimated: >60 mL/min (ref >60)
Glucose, Bld: 78 mg/dL (ref 70–99)
Potassium: 3.3 mmol/L — ABNORMAL LOW (ref 3.5–5.1)
Sodium: 139 mmol/L (ref 135–145)

## 2023-01-24 LAB — CBC
HCT: 31.7 % — ABNORMAL LOW (ref 36.0–46.0)
Hemoglobin: 9.5 g/dL — ABNORMAL LOW (ref 12.0–15.0)
MCH: 26.2 pg (ref 26.0–34.0)
MCHC: 30 g/dL (ref 30.0–36.0)
MCV: 87.3 fL (ref 80.0–100.0)
Platelets: 218 10*3/uL (ref 150–400)
RBC: 3.63 MIL/uL — ABNORMAL LOW (ref 3.87–5.11)
RDW: 16.3 % — ABNORMAL HIGH (ref 11.5–15.5)
WBC: 18 10*3/uL — ABNORMAL HIGH (ref 4.0–10.5)
nRBC: 0 % (ref 0.0–0.2)

## 2023-01-24 LAB — URINE CULTURE: Culture: NO GROWTH

## 2023-01-24 NOTE — Progress Notes (Signed)
PROGRESS NOTE    Hayley Wilson  VHQ:[id number] DOB: 02-04-1935 DOA: 01/23/2023 PCP: Oneita Hurt, No   Brief Narrative:  Hayley Wilson is a 88 y.o. female with medical history significant for hypertension and recently treated UTI as well as presumed bladder malignancy seen on CT scan 10/9 at [facility] health ER being admitted to the hospital with recurrent UTI, sepsis with acute renal failure. Patient continues to lose weight(reports 25lb weight loss in the last 90 days) - has become more dependent on family for ADLs. Recently completed a course of Keflex for presumed UTI after visit to [facility] ED.  She was told that she would be contacted by oncology office to schedule outpatient follow-up, but never received contact from them.    Assessment & Plan:   Principal Problem:   Sepsis (HCC)   Severe sepsis secondary to UTI, POA -Tachycardia, leukocytosis, source is UTI.  She has associated AKI. -Inpatient admission to progressive -Empiric IV Rocephin, follow-up urine culture   AKI secondary to ATN/sepsis vs pre-renal/poor PO intake -Patient received IV fluids in the ED -Liberalize diet, hold further IV fluid for left indicated given shortage   Bladder mass with evidence of metastasis-with lymphadenopathy, bony and spinal mets -IR consulted at intake for potential biopsy, liver ultrasound pending as this may be the most appropriate location for biopsy -Once biopsy completed will have patient follow-up with oncology either here versus at [facility], unclear if she was ever contacted by previous facility.   Hypertension- Hold home medications -currently normotensive   Dysphagia, odynophagia, new Questionable reflux versus regurgitation -Passed speech evaluation, advance diet as tolerated -Consider outpatient endoscopy if symptoms worsen   Ambulatory dysfunction, generalized weakness Moderate to severe protein caloric malnutrition -Likely in the setting of metastatic malignancy -PT OT following -TOC  consulted for possible placement/needs at home   DVT prophylaxis: heparin injection 5,000 Units Start: 01/23/23 1600 SCDs Start: 01/23/23 1512   Code Status:   Code Status: Full Code  Family Communication: At bedside  Status is: Inpatient  Dispo: The patient is from: Home              Anticipated d/c is to: To be determined              Anticipated d/c date is: 48 to 72 hours              Patient currently not medically stable for discharge  Consultants:  IR  Procedures:  Biopsy pending  Antimicrobials:  None indicated  Subjective: No acute issues or events overnight denies nausea vomiting diarrhea constipation headache fevers chills or chest pain  Objective: Vitals:   01/23/23 1426 01/23/23 2108 01/24/23 0455 01/24/23 0524  BP: (!) 136/101 127/80 107/70   Pulse: (!) 105 93 88   Resp: 18 18 18 19   Temp: 97.7 F (36.5 C) 97.7 F (36.5 C) 97.7 F (36.5 C)   TempSrc: Oral     SpO2: 99% 99% 100%   Weight:      Height:        Intake/Output Summary (Last 24 hours) at 01/24/2023 0729 Last data filed at 01/24/2023 0524 Gross per 24 hour  Intake 3149.56 ml  Output 150 ml  Net 2999.56 ml   Filed Weights   01/23/23 1000 01/23/23 1057  Weight: 69.2 kg 69 kg    Examination:  General:  Pleasantly resting in bed, No acute distress. HEENT:  Normocephalic atraumatic.  Sclerae nonicteric, noninjected.  Extraocular movements intact bilaterally. Neck:  Without mass or  deformity.  Trachea is midline. Lungs:  Clear to auscultate bilaterally without rhonchi, wheeze, or rales. Heart:  Regular rate and rhythm.  Without murmurs, rubs, or gallops. Abdomen:  Soft, nontender, nondistended; notable suprapubic mass. Extremities: Without cyanosis, clubbing, edema, or obvious deformity. Skin:  Warm and dry, no erythema.   Data Reviewed: I have personally reviewed following labs and imaging studies  CBC: Recent Labs  Lab 01/23/23 1001 01/24/23 0445  WBC 26.3* 18.0*   NEUTROABS 23.7*  --   HGB 12.4 9.5*  HCT 40.2 31.7*  MCV 84.6 87.3  PLT 265 218   Basic Metabolic Panel: Recent Labs  Lab 01/23/23 1020 01/24/23 0445  NA 139 139  K 4.0 3.3*  CL 94* 102  CO2 25 27  GLUCOSE 109* 78  BUN 32* 27*  CREATININE 1.30* 0.83  CALCIUM 10.0 9.0   GFR: Estimated Creatinine Clearance: 46.4 mL/min (by C-G formula based on SCr of 0.83 mg/dL).  Liver Function Tests: Recent Labs  Lab 01/23/23 1020  AST 31  ALT 15  ALKPHOS 95  BILITOT 1.2  PROT 8.0  ALBUMIN 2.7*    Recent Results (from the past 240 hour(s))  Culture, blood (routine x 2)     Status: None (Preliminary result)   Collection Time: 01/23/23 10:20 AM   Specimen: BLOOD  Result Value Ref Range Status   Specimen Description   Final    BLOOD LEFT ANTECUBITAL Performed at [facility], [address] [place], [place] [address]    Special Requests   Final    BOTTLES DRAWN AEROBIC AND ANAEROBIC Blood Culture adequate volume Performed at [facility], [address] [place], [place] [address]    Culture   Final    NO GROWTH < 24 HOURS Performed at [facility], [address] [place], [place] [address]    Report Status PENDING  Incomplete  Culture, blood (routine x 2)     Status: None (Preliminary result)   Collection Time: 01/23/23 10:20 AM   Specimen: BLOOD LEFT FOREARM  Result Value Ref Range Status   Specimen Description   Final    BLOOD LEFT FOREARM Performed at [facility], [address] [place], [place] [address]    Special Requests   Final    BOTTLES DRAWN AEROBIC ONLY Blood Culture results may not be optimal due to an inadequate volume of blood received in culture bottles Performed at [facility], [address] [place], [place] [address]    Culture   Final    NO GROWTH < 24 HOURS Performed at [facility], [address], [place], [place] [address]    Report Status PENDING  Incomplete    Radiology  Studies: DG Chest Portable 1 View  Result Date: 01/23/2023 CLINICAL DATA:  Altered mental status, weakness. EXAM: PORTABLE CHEST 1 VIEW COMPARISON:  None Available. FINDINGS: The heart size and mediastinal contours are within normal limits. Both lungs are clear. Degenerative changes are seen in the spine and both shoulders. IMPRESSION: No active disease. Electronically Signed   By: Romona Curls M.D.   On: 01/23/2023 11:02    Scheduled Meds:  feeding supplement  237 mL Oral TID BM   heparin  5,000 Units Subcutaneous Q8H   Continuous Infusions:  cefTRIAXone (ROCEPHIN)  IV Stopped (01/23/23 1133)   lactated ringers      LOS: 1 day   Time spent:  Kristine Garbe  Natale Milch, DO [facility]  If 7PM-7AM, please contact night-coverage [email or web address]  01/24/2023, 7:29 AM

## 2023-01-24 NOTE — Progress Notes (Signed)
OT Cancellation Note  Patient Details Name: Hayley Wilson MRN: [id number] DOB: 04/04/1934   Cancelled Treatment:    Reason Eval/Treat Not Completed: Fatigue/lethargy limiting ability to participate. Chart reviewed, pt received stating she is tired / nauseated and grandson asleep. Requests that OT return later. OT will check back as able.  Runa Whittingham L. Nayra Coury, OTR/L  01/24/23, 3:18 PM

## 2023-01-24 NOTE — Evaluation (Signed)
Clinical/Bedside Swallow Evaluation Patient Details  Name: Hayley Wilson MRN: [id number] Date of Birth: [birth year]  Today's Date: 01/24/2023 Time: SLP Start Time (ACUTE ONLY): 1200 SLP Stop Time (ACUTE ONLY): 1215 SLP Time Calculation (min) (ACUTE ONLY): 15 min  Past Medical History:  Past Medical History:  Diagnosis Date   Asthma    Cancer (HCC)    Hypertension    Past Surgical History:  Past Surgical History:  Procedure Laterality Date   KNEE SURGERY     ORIF FIBULA FRACTURE     HPI:  Patient is an 88 y.o. female with PMH: HTN, recently treated UTI and presumed bladder malignancy seen on CT scan 01/05/23 at [facility] ER. Patient lives with his adopted grandson who is 47 years old and having an increasingly difficult time taking care of her. She presented to the hospital on 01/23/23 due to sore throat, difficulty swallowing; regurgitating food, and ongoing weight loss (unintentional loss of at least 25 pounds in past two months per patient report). In ED she was tachycardic, found to have UTI with associated AKI.    Assessment / Plan / Recommendation  Clinical Impression  Patient presents with clinical s/s of dysphagia as per this bedside swallow study. She c/o her throat hurting which makes it difficult to swallow. When SLP examined, her oropharynx was erythemic. Patient was willing to drink some liquids (water) but told SLP that she has not had any solids in a while. During swallows, patient did not fully close lips and exhibited a slight grimace when swallowing and exhibited instances of belching and multiple swallows. No overt s/s aspiration observed. She told SLP that she didn't thinks she would be eating solids "for a while" which would be when her throat pain/discomfort had subsided. She and SLP discussed full liquids diet and was in agreement with that change. SLP will follow briefly to ensure toleration. SLP Visit Diagnosis: Dysphagia, unspecified (R13.10)    Aspiration  Risk  Mild aspiration risk    Diet Recommendation Thin liquid    Liquid Administration via: Cup;Straw Medication Administration: Other (Comment) (whole or crushed in puree) Supervision: Patient able to self feed Compensations: Slow rate;Small sips/bites Postural Changes: Seated upright at 90 degrees    Other  Recommendations Oral Care Recommendations: Oral care BID    Recommendations for follow up therapy are one component of a multi-disciplinary discharge planning process, led by the attending physician.  Recommendations may be updated based on patient status, additional functional criteria and insurance authorization.  Follow up Recommendations Other (comment) (TBD)      Assistance Recommended at Discharge    Functional Status Assessment Patient has had a recent decline in their functional status and demonstrates the ability to make significant improvements in function in a reasonable and predictable amount of time.  Frequency and Duration min 2x/week  1 week       Prognosis Prognosis for improved oropharyngeal function: Good      Swallow Study   General Date of Onset: 01/23/23 HPI: Patient is an 88 y.o. female with PMH: HTN, recently treated UTI and presumed bladder malignancy seen on CT scan 01/05/23 at [facility] ER. Patient lives with his adopted grandson who is 14 years old and having an increasingly difficult time taking care of her. She presented to the hospital on 01/23/23 due to sore throat, difficulty swallowing; regurgitating food, and ongoing weight loss (unintentional loss of at least 25 pounds in past two months per patient report). In ED she was tachycardic, found  to have UTI with associated AKI. Type of Study: Bedside Swallow Evaluation Previous Swallow Assessment: none found Diet Prior to this Study: Regular;Thin liquids (Level 0) Temperature Spikes Noted: No Respiratory Status: Room air Behavior/Cognition: Alert;Cooperative;Pleasant mood Oral Cavity  Assessment: Erythema Oral Care Completed by SLP: No Oral Cavity - Dentition: Poor condition;Adequate natural dentition;Missing dentition Vision: Functional for self-feeding Self-Feeding Abilities: Able to feed self Patient Positioning: Upright in bed Baseline Vocal Quality: Normal Volitional Cough: Weak Volitional Swallow: Able to elicit    Oral/Motor/Sensory Function Overall Oral Motor/Sensory Function: Within functional limits   Ice Chips Ice chips: Not tested   Thin Liquid Thin Liquid: Impaired Oral Phase Impairments: Reduced labial seal Pharyngeal  Phase Impairments: Multiple swallows    Nectar Thick     Honey Thick     Puree Puree: Not tested   Solid     Solid: Not tested      Angela Nevin, MA, CCC-SLP Speech Therapy

## 2023-01-24 NOTE — Consult Note (Signed)
Chief Complaint: Patient was seen in consultation today for liver lesions  Referring Physician(s): Mir Kirby Crigler, MD  Supervising Physician: Irish Lack  Patient Status: [facility] - In-pt  History of Present Illness: Hayley Wilson is a 88 y.o. female with PMH significant for asthma and hypertension being seen today in relation to newly discovered liver lesions. Patient underwent CT scan at [facility] on 01/05/23 which revealed a bladder mass concerning for malignancy along with liver lesions concerning for metastasis. Patient was admitted to [facility] on 01/23/23 for UTI and sepsis. Request was received to evaluate patient for possible biopsy to evaluate suspected malignancy.  Past Medical History:  Diagnosis Date   Asthma    Cancer (HCC)    Hypertension     Past Surgical History:  Procedure Laterality Date   KNEE SURGERY     ORIF FIBULA FRACTURE      Allergies: Allopurinol, Atenolol, Hydrochlorothiazide, Amlodipine, and Lisinopril  Medications: Prior to Admission medications   Medication Sig Start Date End Date Taking? Authorizing Provider  brimonidine (ALPHAGAN) 0.2 % ophthalmic solution Place 1 drop into both eyes 3 (three) times daily.   Yes [provider]  dorzolamide-timolol (COSOPT) 2-0.5 % ophthalmic solution Place 1 drop into both eyes 2 (two) times daily.   Yes [provider]  latanoprost (XALATAN) 0.005 % ophthalmic solution Place 1 drop into both eyes at bedtime.   Yes [provider]  Multiple Vitamins-Minerals (MULTIVITAMIN WOMENS 50+ ADV) TABS Take 1 tablet by mouth daily with breakfast.   Yes [provider]  TYLENOL 500 MG tablet Take 500 mg by mouth every 6 (six) hours as needed for mild pain (pain score 1-3) or headache.   Yes [provider]  hydrochlorothiazide (HYDRODIURIL) 12.5 MG tablet Take 1 tablet (12.5 mg total) by mouth daily. Patient not taking: Reported on 01/23/2023 03/15/14   Sheliah Hatch,  MD  HYDROcodone-acetaminophen (NORCO/VICODIN) 5-325 MG per tablet Take 1 tablet by mouth every 4 (four) hours as needed. Patient not taking: Reported on 01/23/2023 12/26/13   Linwood Dibbles, MD  meclizine (ANTIVERT) 12.5 MG tablet Take 1 tablet (12.5 mg total) by mouth 3 (three) times daily as needed for dizziness. Patient not taking: Reported on 01/23/2023 08/28/21   Rolan Bucco, MD     Family History  Problem Relation Age of Onset   Arthritis Mother    Arthritis Maternal Aunt    Diabetes Paternal Uncle    Stroke Maternal Grandmother     Social History   Socioeconomic History   Marital status: Legally Separated    Spouse name: Not on file   Number of children: Not on file   Years of education: Not on file   Highest education level: Not on file  Occupational History   Not on file  Tobacco Use   Smoking status: Never   Smokeless tobacco: Never  Substance and Sexual Activity   Alcohol use: No   Drug use: No   Sexual activity: Never  Other Topics Concern   Not on file  Social History Narrative   Not on file   Social Determinants of Health   Financial Resource Strain: Not on file  Food Insecurity: Not on file  Transportation Needs: Not on file  Physical Activity: Not on file  Stress: Not on file  Social Connections: Unknown (04/26/2022)   Received from [facility]   Social Network    Social Network: Not on file    Code Status: Full code  Review of  Systems: A 12 point ROS discussed and pertinent positives are indicated in the HPI above.  All other systems are negative.  Review of Systems  Constitutional:  Positive for appetite change. Negative for chills and fever.  Respiratory:  Negative for chest tightness and shortness of breath.   Cardiovascular:  Negative for chest pain and leg swelling.  Gastrointestinal:  Positive for diarrhea and nausea. Negative for abdominal pain and vomiting.  Neurological:  Negative for dizziness and headaches.  Psychiatric/Behavioral:   Negative for confusion.     Vital Signs: BP 119/80 (BP Location: Right Arm)   Pulse 93   Temp 97.7 F (36.5 C) (Oral)   Resp 17   Ht 5\' 4"  (1.626 m)   Wt 152 lb 1.6 oz (69 kg)   SpO2 98%   BMI 26.11 kg/m    Physical Exam Vitals reviewed.  Constitutional:      Appearance: She is ill-appearing.  HENT:     Mouth/Throat:     Mouth: Mucous membranes are moist.  Cardiovascular:     Rate and Rhythm: Normal rate and regular rhythm.     Pulses: Normal pulses.     Heart sounds: Normal heart sounds.  Pulmonary:     Effort: Pulmonary effort is normal.     Breath sounds: Normal breath sounds.  Abdominal:     Palpations: Abdomen is soft.     Tenderness: There is no abdominal tenderness.  Musculoskeletal:     Right lower leg: No edema.     Left lower leg: No edema.  Skin:    General: Skin is warm and dry.  Neurological:     Mental Status: She is alert and oriented to person, place, and time.  Psychiatric:        Mood and Affect: Mood normal.        Behavior: Behavior normal.        Thought Content: Thought content normal.        Judgment: Judgment normal.     Imaging: US Abdomen Limited RUQ (LIVER/GB)  Result Date: 01/24/2023 CLINICAL DATA:  Liver lesions. EXAM: ULTRASOUND ABDOMEN LIMITED RIGHT UPPER QUADRANT COMPARISON:  None Available. FINDINGS: Gallbladder: Gallbladder is contracted with wall thickening, nonspecific. No obvious stones. Common bile duct: Diameter: 11 mm. Liver: Hypoechoic lesions identified along the liver. Central focus measures 3.0 x 2.3 x 2.8 cm. Additional smaller foci suggested elsewhere. Portal vein is patent on color Doppler imaging with normal direction of blood flow towards the liver. Other: The sonographer does state evaluation limited as the patient had difficulty tolerating the procedure. IMPRESSION: Multiple subtle hypoechoic liver lesions measuring up to 3.0 cm. Possible a contrast CT or MRI may be of some benefit to further delineate. Contracted  gallbladder with wall thickening but this could relate to the level of distention. If there is concern of gallbladder pathology a follow up study with prolonged fasting could be considered as clinically appropriate Electronically Signed   By: Karen Kays M.D.   On: 01/24/2023 12:05   DG Chest Portable 1 View  Result Date: 01/23/2023 CLINICAL DATA:  Altered mental status, weakness. EXAM: PORTABLE CHEST 1 VIEW COMPARISON:  None Available. FINDINGS: The heart size and mediastinal contours are within normal limits. Both lungs are clear. Degenerative changes are seen in the spine and both shoulders. IMPRESSION: No active disease. Electronically Signed   By: Romona Curls M.D.   On: 01/23/2023 11:02    Labs:  CBC: Recent Labs    01/23/23 1001 01/24/23 0445  WBC 26.3* 18.0*  HGB 12.4 9.5*  HCT 40.2 31.7*  PLT 265 218    COAGS: No results for input(s): "INR", "APTT" in the last 8760 hours.  BMP: Recent Labs    01/23/23 1020 01/24/23 0445  NA 139 139  K 4.0 3.3*  CL 94* 102  CO2 25 27  GLUCOSE 109* 78  BUN 32* 27*  CALCIUM 10.0 9.0  CREATININE 1.30* 0.83  GFRNONAA 40* >60    LIVER FUNCTION TESTS: Recent Labs    01/23/23 1020  BILITOT 1.2  AST 31  ALT 15  ALKPHOS 95  PROT 8.0  ALBUMIN 2.7*    TUMOR MARKERS: No results for input(s): "AFPTM", "CEA", "CA199", "CHROMGRNA" in the last 8760 hours.  Assessment and Plan:  Zahirah Norgren is an 88 yo female being seen today in relation to suspected malignancy. Of note, patient currently admitted for sepsis with suspected UTI source and on IV Rocephin. IR was consulted based on imaging from [facility] concerning for bladder mass and possible liver metastases. After initial review by Dr Fredia Sorrow, a RUQ [place] was ordered to further evaluate patient's liver masses. US performed today revealed hypoechoic lesions with largest lesion measuring 3.0 x 2.3 x 2.8 cm. Imaging was reviewed and approved by Dr Fredia Sorrow for image-guided liver biopsy. Case  tentatively set up for 01/25/23 pending improvement in patient's sepsis and WBC. Patient made NPO, pre-procedural labs ordered.  Risks and benefits of image-guided liver biopsy was discussed with the patient and/or patient's family including, but not limited to bleeding, infection, damage to adjacent structures or low yield requiring additional tests.  All of the questions were answered and there is agreement to proceed.  Consent signed and in chart.   Thank you for this interesting consult.  I greatly enjoyed meeting Hayley Wilson and look forward to participating in their care.  A copy of this report was sent to the requesting provider on this date.  Electronically Signed: Kennieth Francois, PA-C 01/24/2023, 4:04 PM   I spent a total of 20 Minutes in face to face in clinical consultation, greater than 50% of which was counseling/coordinating care for liver lesions.

## 2023-01-24 NOTE — Progress Notes (Signed)
PT Cancellation Note  Patient Details Name: Hayley Wilson MRN: [id number] DOB: 1935-03-04   Cancelled Treatment:    Reason Eval/Treat Not Completed: Fatigue/lethargy limiting ability to participate. PT arrived 1359 and pt and visitor both sleeping. PT to return if schedule allows and to continue to follow acutely.   Johnny Bridge, PT Acute Rehab   Jacqualyn Posey 01/24/2023, 2:43 PM

## 2023-01-25 ENCOUNTER — Inpatient Hospital Stay (HOSPITAL_COMMUNITY): Payer: Medicare (Managed Care)

## 2023-01-25 DIAGNOSIS — N179 Acute kidney failure, unspecified: Secondary | ICD-10-CM | POA: Diagnosis not present

## 2023-01-25 DIAGNOSIS — A419 Sepsis, unspecified organism: Secondary | ICD-10-CM | POA: Diagnosis not present

## 2023-01-25 DIAGNOSIS — N390 Urinary tract infection, site not specified: Secondary | ICD-10-CM | POA: Diagnosis not present

## 2023-01-25 HISTORY — PX: IR US LIVER BIOPSY: IMG936

## 2023-01-25 LAB — CBC
HCT: 30.6 % — ABNORMAL LOW (ref 36.0–46.0)
Hemoglobin: 9.3 g/dL — ABNORMAL LOW (ref 12.0–15.0)
MCH: 26.3 pg (ref 26.0–34.0)
MCHC: 30.4 g/dL (ref 30.0–36.0)
MCV: 86.7 fL (ref 80.0–100.0)
Platelets: 195 10*3/uL (ref 150–400)
RBC: 3.53 MIL/uL — ABNORMAL LOW (ref 3.87–5.11)
RDW: 16.3 % — ABNORMAL HIGH (ref 11.5–15.5)
WBC: 12 10*3/uL — ABNORMAL HIGH (ref 4.0–10.5)
nRBC: 0 % (ref 0.0–0.2)

## 2023-01-25 LAB — BASIC METABOLIC PANEL
Anion gap: 11 (ref 5–15)
BUN: 25 mg/dL — ABNORMAL HIGH (ref 8–23)
CO2: 27 mmol/L (ref 22–32)
Calcium: 9.2 mg/dL (ref 8.9–10.3)
Chloride: 106 mmol/L (ref 98–111)
Creatinine, Ser: 0.73 mg/dL (ref 0.44–1.00)
GFR, Estimated: >60 mL/min (ref >60)
Glucose, Bld: 86 mg/dL (ref 70–99)
Potassium: 3.1 mmol/L — ABNORMAL LOW (ref 3.5–5.1)
Sodium: 144 mmol/L (ref 135–145)

## 2023-01-25 LAB — PROTIME-INR
INR: 1.1 (ref 0.8–1.2)
Prothrombin Time: 14.4 s (ref 11.4–15.2)

## 2023-01-25 MED ORDER — FENTANYL CITRATE (PF) 100 MCG/2ML IJ SOLN
INTRAMUSCULAR | Status: AC | PRN
  Administered 2023-01-25: 25 ug via INTRAVENOUS

## 2023-01-25 MED ORDER — MIDAZOLAM HCL 2 MG/2ML IJ SOLN
INTRAMUSCULAR | Status: AC
  Filled 2023-01-25: qty 2

## 2023-01-25 MED ORDER — FENTANYL CITRATE (PF) 100 MCG/2ML IJ SOLN
INTRAMUSCULAR | Status: AC
  Filled 2023-01-25: qty 2

## 2023-01-25 MED ORDER — LIDOCAINE-EPINEPHRINE 1 %-1:100000 IJ SOLN
INTRAMUSCULAR | Status: AC
  Filled 2023-01-25: qty 1

## 2023-01-25 MED ORDER — GELATIN ABSORBABLE 12-7 MM EX MISC
CUTANEOUS | Status: AC
Start: 2023-01-25 — End: ?
  Filled 2023-01-25: qty 1

## 2023-01-25 MED ORDER — GELATIN ABSORBABLE 12-7 MM EX MISC
1.0000 | Freq: Once | CUTANEOUS | Status: AC
  Administered 2023-01-25: 1 via TOPICAL

## 2023-01-25 MED ORDER — LIDOCAINE-EPINEPHRINE 1 %-1:100000 IJ SOLN
20.0000 mL | Freq: Once | INTRAMUSCULAR | Status: AC
  Administered 2023-01-25: 10 mL via INTRADERMAL

## 2023-01-25 MED ORDER — POTASSIUM CHLORIDE CRYS ER 20 MEQ PO TBCR
40.0000 meq | EXTENDED_RELEASE_TABLET | Freq: Once | ORAL | Status: DC
  Filled 2023-01-25: qty 2

## 2023-01-25 MED ORDER — MIDAZOLAM HCL 2 MG/2ML IJ SOLN
INTRAMUSCULAR | Status: AC | PRN
  Administered 2023-01-25: .5 mg via INTRAVENOUS

## 2023-01-25 MED ORDER — POTASSIUM CHLORIDE 20 MEQ PO PACK
40.0000 meq | PACK | Freq: Once | ORAL | Status: AC
  Administered 2023-01-25: 40 meq via ORAL
  Filled 2023-01-25: qty 2

## 2023-01-25 NOTE — Procedures (Signed)
Interventional Radiology Procedure Note  Procedure: Ultrasound guided liver mass biopsy  Findings: Please refer to procedural dictation for full description. Right lobe liver mass, 18 ga core x2. Gelfoam slurry needle track embolization.  Complications: None immediate  Estimated Blood Loss: < 5 ml  Recommendations: Strict 3 hour bed rest. Follow Pathology results.   Marliss Coots, MD

## 2023-01-25 NOTE — Plan of Care (Signed)
  Problem: Education: Goal: Knowledge of General Education information will improve Description: Including pain rating scale, medication(s)/side effects and non-pharmacologic comfort measures Outcome: Progressing   Problem: Clinical Measurements: Goal: Ability to maintain clinical measurements within normal limits will improve Outcome: Progressing Goal: Will remain free from infection Outcome: Progressing Goal: Diagnostic test results will improve Outcome: Progressing Goal: Respiratory complications will improve Outcome: Progressing Goal: Cardiovascular complication will be avoided Outcome: Progressing   Problem: Nutrition: Goal: Adequate nutrition will be maintained Outcome: Progressing   Problem: Coping: Goal: Level of anxiety will decrease Outcome: Progressing   

## 2023-01-25 NOTE — Progress Notes (Signed)
PROGRESS NOTE    JAZZMIN Wilson  DGL:[id number] DOB: 1934-09-18 DOA: 01/23/2023 PCP: Oneita Hurt, No   Brief Narrative:  Hayley Wilson is a 88 y.o. female with medical history significant for hypertension and recently treated UTI as well as presumed bladder malignancy seen on CT scan 10/9 at [facility] health ER being admitted to the hospital with recurrent UTI, sepsis with acute renal failure. Patient continues to lose weight(reports 25lb weight loss in the last 90 days) - has become more dependent on family for ADLs. Recently completed a course of Keflex for presumed UTI after visit to [facility] ED.  She was told that she would be contacted by oncology office to schedule outpatient follow-up, but never received contact from them.    Assessment & Plan:   Principal Problem:   Sepsis (HCC)   Severe sepsis secondary to UTI, POA -Tachycardia, leukocytosis, source is UTI.  She has associated AKI. -Inpatient admission to progressive -Empiric IV Rocephin, follow-up urine culture   AKI secondary to ATN/sepsis vs pre-renal/poor PO intake -Patient received IV fluids in the ED -Liberalize diet, hold further IV fluid for left indicated given shortage   Bladder mass with evidence of metastasis-noted lymphadenopathy, bony and spinal mets -IR consulted at intake for potential biopsy, liver ultrasound shows multiple hypoechoic lesions up to 3 cm -18-gauge core biopsy x 2 right lobe liver mass 10/29 -Follow-up with oncology either here versus at [facility], unclear if she was ever contacted by previous facility.   Hypertension Hold home medications -currently normotensive   Dysphagia, odynophagia, new Questionable reflux versus regurgitation -Passed speech evaluation, advance diet as tolerated -Consider outpatient endoscopy if symptoms worsen   Ambulatory dysfunction, generalized weakness Moderate to severe protein caloric malnutrition -Likely in the setting of metastatic malignancy -PT OT following -TOC  consulted for possible placement/needs at home   DVT prophylaxis: heparin injection 5,000 Units Start: 01/23/23 1600 SCDs Start: 01/23/23 1512   Code Status:   Code Status: Full Code  Family Communication: At bedside  Status is: Inpatient  Dispo: The patient is from: Home              Anticipated d/c is to: To be determined              Anticipated d/c date is: 48 to 72 hours              Patient currently not medically stable for discharge  Consultants:  IR  Procedures:  Biopsy 10/29  Antimicrobials:  None indicated  Subjective: No acute issues or events overnight denies nausea vomiting diarrhea constipation headache fevers chills or chest pain  Objective: Vitals:   01/24/23 0455 01/24/23 0524 01/24/23 1231 01/24/23 2014  BP: 107/70  119/80 114/74  Pulse: 88  93 88  Resp: 18 19 17 20   Temp: 97.7 F (36.5 C)  97.7 F (36.5 C) 98.2 F (36.8 C)  TempSrc:   Oral Oral  SpO2: 100%  98% 100%  Weight:      Height:        Intake/Output Summary (Last 24 hours) at 01/25/2023 0714 Last data filed at 01/24/2023 2000 Gross per 24 hour  Intake 329.05 ml  Output 100 ml  Net 229.05 ml   Filed Weights   01/23/23 1000 01/23/23 1057  Weight: 69.2 kg 69 kg    Examination:  General:  Pleasantly resting in bed, No acute distress. HEENT:  Normocephalic atraumatic.  Sclerae nonicteric, noninjected.  Extraocular movements intact bilaterally. Neck:  Without mass or deformity.  Trachea is midline. Lungs:  Clear to auscultate bilaterally without rhonchi, wheeze, or rales. Heart:  Regular rate and rhythm.  Without murmurs, rubs, or gallops. Abdomen:  Soft, nontender, nondistended; notable suprapubic mass. Extremities: Without cyanosis, clubbing, edema, or obvious deformity. Skin:  Warm and dry, no erythema.  Data Reviewed: I have personally reviewed following labs and imaging studies  CBC: Recent Labs  Lab 01/23/23 1001 01/24/23 0445 01/25/23 0412  WBC 26.3* 18.0*  12.0*  NEUTROABS 23.7*  --   --   HGB 12.4 9.5* 9.3*  HCT 40.2 31.7* 30.6*  MCV 84.6 87.3 86.7  PLT 265 218 195   Basic Metabolic Panel: Recent Labs  Lab 01/23/23 1020 01/24/23 0445 01/25/23 0412  NA 139 139 144  K 4.0 3.3* 3.1*  CL 94* 102 106  CO2 25 27 27   GLUCOSE 109* 78 86  BUN 32* 27* 25*  CREATININE 1.30* 0.83 0.73  CALCIUM 10.0 9.0 9.2   GFR: Estimated Creatinine Clearance: 48.1 mL/min (by C-G formula based on SCr of 0.73 mg/dL).  Liver Function Tests: Recent Labs  Lab 01/23/23 1020  AST 31  ALT 15  ALKPHOS 95  BILITOT 1.2  PROT 8.0  ALBUMIN 2.7*    Recent Results (from the past 240 hour(s))  Urine Culture     Status: None   Collection Time: 01/23/23 10:01 AM   Specimen: Urine, Catheterized  Result Value Ref Range Status   Specimen Description   Final    URINE, CATHETERIZED Performed at [facility], [address] [place], [place] [address]    Special Requests   Final    NONE Performed at [facility], [address] [place], [place] [address]    Culture   Final    NO GROWTH Performed at [facility], [address] [place], [place] [address]    Report Status 01/24/2023 FINAL  Final  Culture, blood (routine x 2)     Status: None (Preliminary result)   Collection Time: 01/23/23 10:20 AM   Specimen: BLOOD  Result Value Ref Range Status   Specimen Description   Final    BLOOD LEFT ANTECUBITAL Performed at [facility], [address] [place], [place] [address]    Special Requests   Final    BOTTLES DRAWN AEROBIC AND ANAEROBIC Blood Culture adequate volume Performed at [facility], [address] [place], [place] [address]    Culture   Final    NO GROWTH < 24 HOURS Performed at [facility], [address] [place], [place] [address]    Report Status PENDING  Incomplete  Culture, blood (routine x 2)     Status: None (Preliminary result)   Collection Time: 01/23/23 10:20 AM    Specimen: BLOOD LEFT FOREARM  Result Value Ref Range Status   Specimen Description   Final    BLOOD LEFT FOREARM Performed at [facility], [address] [place], [place] [address]    Special Requests   Final    BOTTLES DRAWN AEROBIC ONLY Blood Culture results may not be optimal due to an inadequate volume of blood received in culture bottles Performed at [facility], [address] [place], [place] [address]    Culture   Final    NO GROWTH < 24 HOURS Performed at [facility], [address] [place], [place] [address]    Report Status  PENDING  Incomplete    Radiology Studies: US Abdomen Limited RUQ (LIVER/GB)  Result Date: 01/24/2023 CLINICAL DATA:  Liver lesions. EXAM: ULTRASOUND ABDOMEN LIMITED RIGHT UPPER QUADRANT COMPARISON:  None Available. FINDINGS: Gallbladder: Gallbladder is contracted with wall thickening, nonspecific. No obvious stones. Common bile duct: Diameter: 11 mm. Liver: Hypoechoic lesions identified along the liver. Central focus measures 3.0 x 2.3 x 2.8 cm. Additional smaller foci suggested elsewhere. Portal vein is patent on color Doppler imaging with normal direction of blood flow towards the liver. Other: The sonographer does state evaluation limited as the patient had difficulty tolerating the procedure. IMPRESSION: Multiple subtle hypoechoic liver lesions measuring up to 3.0 cm. Possible a contrast CT or MRI may be of some benefit to further delineate. Contracted gallbladder with wall thickening but this could relate to the level of distention. If there is concern of gallbladder pathology a follow up study with prolonged fasting could be considered as clinically appropriate Electronically Signed   By: Karen Kays M.D.   On: 01/24/2023 12:05   DG Chest Portable 1 View  Result Date: 01/23/2023 CLINICAL DATA:  Altered mental status, weakness. EXAM: PORTABLE CHEST 1 VIEW COMPARISON:  None Available. FINDINGS: The heart size and  mediastinal contours are within normal limits. Both lungs are clear. Degenerative changes are seen in the spine and both shoulders. IMPRESSION: No active disease. Electronically Signed   By: Romona Curls M.D.   On: 01/23/2023 11:02    Scheduled Meds:  feeding supplement  237 mL Oral TID BM   heparin  5,000 Units Subcutaneous Q8H   Continuous Infusions:  cefTRIAXone (ROCEPHIN)  IV Stopped (01/24/23 1310)   lactated ringers      LOS: 2 days   Time spent:  Azucena Fallen, DO [facility]  If 7PM-7AM, please contact night-coverage [email or web address]  01/25/2023, 7:14 AM

## 2023-01-25 NOTE — Progress Notes (Addendum)
OT Cancellation Note  Patient Details Name: Hayley Wilson MRN: [id number] DOB: 20-Aug-1934   Cancelled Treatment:    Reason Eval/Treat Not Completed: Patient at procedure or test/ unavailable. Pt off the floor, OT will re-attempt eval as able.  Addendum: pt returned from biopsy, on strict bedrest until 1617. OT will check back as able for evaluation.    Ovid Witman L. Ellowyn Rieves, OTR/L  01/25/23, 10:47 AM

## 2023-01-25 NOTE — Progress Notes (Signed)
PT Cancellation Note  Patient Details Name: Hayley Wilson MRN: [id number] DOB: 07/25/1934   Cancelled Treatment:    Reason Eval/Treat Not Completed: Patient at procedure or test/unavailable--recent return from biopsy-on strict bedrest for 3 hours. will check back as schedule allows.    Faye Ramsay, PT Acute Rehabilitation  Office: [phone]

## 2023-01-25 NOTE — Evaluation (Signed)
Physical Therapy Evaluation Patient Details Name: Hayley Wilson MRN: [id number] DOB: 01-Aug-1934 Today's Date: 01/25/2023  History of Present Illness  88 yo female admitted with Sepsis, UTI, weakness. Hx of asthma, bladder mass with mets, ORIF R LE  Clinical Impression  On eval, pt required Mod A for bed mobility and sit to stand transfers. Pt was unable to take any steps on today 2* weakness, fatigue. She was able to laterally scoot along edge of bed with Mod A. Assisted pt back to bed at end of session. Will plan to follow and progress activity as tolerated. Patient will benefit from continued inpatient follow up therapy, <3 hours/day         If plan is discharge home, recommend the following: A lot of help with walking and/or transfers;A lot of help with bathing/dressing/bathroom;Assistance with cooking/housework;Assist for transportation;Help with stairs or ramp for entrance   Can travel by private vehicle   No    Equipment Recommendations  (TBD at next venue)  Recommendations for Other Services       Functional Status Assessment Patient has had a recent decline in their functional status and demonstrates the ability to make significant improvements in function in a reasonable and predictable amount of time.     Precautions / Restrictions Precautions Precautions: Fall Restrictions Weight Bearing Restrictions: No      Mobility  Bed Mobility Overal bed mobility: Needs Assistance Bed Mobility: Supine to Sit, Sit to Supine     Supine to sit: Mod assist, HOB elevated, Used rails Sit to supine: Mod assist, HOB elevated   General bed mobility comments: Assist for trunk and bil LEs. Increased time. Cues provided.    Transfers Overall transfer level: Needs assistance Equipment used: Rolling walker (2 wheels) Transfers: Sit to/from Stand Sit to Stand: Mod assist, From elevated surface           General transfer comment: Sit to stand x 2-pt stood for ~10 seconds before  having to sit. Lateral scoots along edge of bed using bedpad to aid with scooting.    Ambulation/Gait                  Stairs            Wheelchair Mobility     Tilt Bed    Modified Rankin (Stroke Patients Only)       Balance Overall balance assessment: Needs assistance         Standing balance support: Bilateral upper extremity supported, Reliant on assistive device for balance, During functional activity Standing balance-Leahy Scale: Poor                               Pertinent Vitals/Pain Pain Assessment Pain Assessment: Faces Faces Pain Scale: Hurts a little bit Pain Location: site of biopsy Pain Descriptors / Indicators: Discomfort Pain Intervention(s): Limited activity within patient's tolerance, Monitored during session, Repositioned    Home Living Family/patient expects to be discharged to:: Unsure Living Arrangements: Other relatives;Alone   Type of Home: House Home Access: Stairs to enter   [employer] of Steps: 3   Home Layout: One level Home Equipment: Cane - single Librarian, academic (2 wheels)      Prior Function Prior Level of Function : Independent/Modified Independent             Mobility Comments: using cane ADLs Comments: reports mod ind     Extremity/Trunk Assessment   Upper Extremity  Assessment Upper Extremity Assessment: Defer to OT evaluation    Lower Extremity Assessment Lower Extremity Assessment: Generalized weakness    Cervical / Trunk Assessment Cervical / Trunk Assessment: Kyphotic  Communication   Communication Communication: No apparent difficulties  Cognition Arousal: Alert Behavior During Therapy: WFL for tasks assessed/performed Overall Cognitive Status: Within Functional Limits for tasks assessed                                          General Comments      Exercises     Assessment/Plan    PT Assessment Patient needs continued PT services   PT Problem List Decreased strength;Decreased range of motion;Decreased activity tolerance;Decreased knowledge of use of DME;Decreased balance;Decreased mobility;Pain       PT Treatment Interventions DME instruction;Gait training;Stair training;Functional mobility training;Therapeutic activities;Therapeutic exercise;Balance training;Patient/family education    PT Goals (Current goals can be found in the Care Plan section)  Acute Rehab PT Goals Patient Stated Goal: none stated PT Goal Formulation: With patient Time For Goal Achievement: 02/08/23 Potential to Achieve Goals: Good    Frequency Min 1X/week     Co-evaluation               AM-PAC PT "6 Clicks" Mobility  Outcome Measure Help needed turning from your back to your side while in a flat bed without using bedrails?: A Lot Help needed moving from lying on your back to sitting on the side of a flat bed without using bedrails?: A Lot Help needed moving to and from a bed to a chair (including a wheelchair)?: Total Help needed standing up from a chair using your arms (e.g., wheelchair or bedside chair)?: A Lot Help needed to walk in hospital room?: Total Help needed climbing 3-5 steps with a railing? : Total 6 Click Score: 9    End of Session Equipment Utilized During Treatment: Gait belt Activity Tolerance: Patient limited by fatigue Patient left: in bed;with call bell/phone within reach;with bed alarm set;with family/visitor present   PT Visit Diagnosis: Muscle weakness (generalized) (M62.81);Difficulty in walking, not elsewhere classified (R26.2)    Time: 2952-8413 PT Time Calculation (min) (ACUTE ONLY): 16 min   Charges:   PT Evaluation $PT Eval Low Complexity: 1 Low   PT General Charges $$ ACUTE PT VISIT: 1 Visit            Faye Ramsay, PT Acute Rehabilitation  Office: [phone]

## 2023-01-25 NOTE — TOC Initial Note (Signed)
Transition of Care [facility]) - Initial/Assessment Note    Patient Details  Name: Hayley Wilson MRN: [id number] Date of Birth: 1934/10/19  Transition of Care [facility]) CM/SW Contact:    Larrie Kass, LCSW Phone Number: 01/25/2023, 4:37 PM  Clinical Narrative:                  Pt has rec for PT eval , CSW received consult for SNF placement. Will await PT recommendations. TOC to follow.  Expected Discharge Plan:  (TBD) Barriers to Discharge: Continued Medical Work up   Patient Goals and CMS Choice            Expected Discharge Plan and Services                                              Prior Living Arrangements/Services                       Activities of Daily Living   ADL Screening (condition at time of admission) Independently performs ADLs?: No Does the patient have a NEW difficulty with bathing/dressing/toileting/self-feeding that is expected to last >3 days?: Yes (Initiates electronic notice to provider for possible OT consult) Does the patient have a NEW difficulty with getting in/out of bed, walking, or climbing stairs that is expected to last >3 days?: Yes (Initiates electronic notice to provider for possible PT consult) Does the patient have a NEW difficulty with communication that is expected to last >3 days?: No Is the patient deaf or have difficulty hearing?: No Does the patient have difficulty seeing, even when wearing glasses/contacts?: No Does the patient have difficulty concentrating, remembering, or making decisions?: No  Permission Sought/Granted                  Emotional Assessment              Admission diagnosis:  Dehydration [E86.0] FTT (failure to thrive) in adult [R62.7] Sepsis (HCC) [A41.9] Primary cancer of bladder with metastasis to other site [facility]) [C67.9] Sepsis without acute organ dysfunction, due to unspecified organism [facility]) [A41.9] Patient Active Problem List   Diagnosis Date Noted   Sepsis  (HCC) 01/23/2023   Closed pelvic fracture (HCC) 01/04/2014   HTN (hypertension) 04/09/2013   Glaucoma 04/09/2013   Obesity (BMI 30-39.9) 04/09/2013   PCP:  Oneita Hurt No Pharmacy:   [facility] [address], [place] - [address] [place] [address] Phone: [phone] Fax: [phone]     Social Determinants of Health (SDOH) Social History: SDOH Screenings   Social Connections: Unknown (04/26/2022)   Received from [facility]  Tobacco Use: Low Risk  (01/23/2023)   SDOH Interventions:     Readmission Risk Interventions     No data to display

## 2023-01-25 NOTE — Progress Notes (Signed)
Speech Language Pathology Treatment: Dysphagia  Patient Details Name: Hayley Wilson MRN: [id number] DOB: 1934-06-24 Today's Date: 01/25/2023 Time: 2440-1027 SLP Time Calculation (min) (ACUTE ONLY): 16 min  Assessment / Plan / Recommendation Clinical Impression  Patient seen by SLP for skilled treatment focused on dysphagia goals. Patient was awake, alert, reporting some pain at site of liver biopsy from today's procedure. She indicated that swallowing was easier and not as painful as it was previous date. SLP observed her with PO intake of thin liquids via straw sips. She exhibited improved bilabial seal on straw when drinking as compared to previous date. Slight grimace at times but no c/o pain when swallowing. SLP then discussed solid texture options to see what patient felt comfortable trying. She and SLP both in agreement to advance to Dys 2 (minced) solids, thin liquids. SLP will continue to follow for diet toleration.    HPI HPI: Patient is an 88 y.o. female with PMH: HTN, recently treated UTI and presumed bladder malignancy seen on CT scan 01/05/23 at [facility] ER. Patient lives with his adopted grandson who is 11 years old and having an increasingly difficult time taking care of her. She presented to the hospital on 01/23/23 due to sore throat, difficulty swallowing; regurgitating food, and ongoing weight loss (unintentional loss of at least 25 pounds in past two months per patient report). In ED she was tachycardic, found to have UTI with associated AKI.      SLP Plan  Continue with current plan of care      Recommendations for follow up therapy are one component of a multi-disciplinary discharge planning process, led by the attending physician.  Recommendations may be updated based on patient status, additional functional criteria and insurance authorization.    Recommendations  Diet recommendations: Dysphagia 2 (fine chop);Thin liquid Liquids provided via: Cup;Straw Medication  Administration: Other (Comment) Supervision: Patient able to self feed;Intermittent supervision to cue for compensatory strategies Compensations: Slow rate;Small sips/bites Postural Changes and/or Swallow Maneuvers: Seated upright 90 degrees                  Oral care BID   Set up Supervision/Assistance Dysphagia, unspecified (R13.10)     Continue with current plan of care     Angela Nevin, MA, CCC-SLP Speech Therapy

## 2023-01-26 DIAGNOSIS — R6520 Severe sepsis without septic shock: Secondary | ICD-10-CM | POA: Diagnosis not present

## 2023-01-26 DIAGNOSIS — C679 Malignant neoplasm of bladder, unspecified: Secondary | ICD-10-CM

## 2023-01-26 DIAGNOSIS — C787 Secondary malignant neoplasm of liver and intrahepatic bile duct: Secondary | ICD-10-CM

## 2023-01-26 DIAGNOSIS — A419 Sepsis, unspecified organism: Secondary | ICD-10-CM | POA: Diagnosis not present

## 2023-01-26 DIAGNOSIS — N390 Urinary tract infection, site not specified: Secondary | ICD-10-CM | POA: Diagnosis not present

## 2023-01-26 LAB — CBC
HCT: 31.6 % — ABNORMAL LOW (ref 36.0–46.0)
Hemoglobin: 9.3 g/dL — ABNORMAL LOW (ref 12.0–15.0)
MCH: 26.1 pg (ref 26.0–34.0)
MCHC: 29.4 g/dL — ABNORMAL LOW (ref 30.0–36.0)
MCV: 88.5 fL (ref 80.0–100.0)
Platelets: 195 10*3/uL (ref 150–400)
RBC: 3.57 MIL/uL — ABNORMAL LOW (ref 3.87–5.11)
RDW: 16.5 % — ABNORMAL HIGH (ref 11.5–15.5)
WBC: 9.8 10*3/uL (ref 4.0–10.5)
nRBC: 0 % (ref 0.0–0.2)

## 2023-01-26 LAB — BASIC METABOLIC PANEL
Anion gap: 9 (ref 5–15)
BUN: 20 mg/dL (ref 8–23)
CO2: 26 mmol/L (ref 22–32)
Calcium: 8.8 mg/dL — ABNORMAL LOW (ref 8.9–10.3)
Chloride: 105 mmol/L (ref 98–111)
Creatinine, Ser: 0.5 mg/dL (ref 0.44–1.00)
GFR, Estimated: >60 mL/min (ref >60)
Glucose, Bld: 78 mg/dL (ref 70–99)
Potassium: 3.8 mmol/L (ref 3.5–5.1)
Sodium: 140 mmol/L (ref 135–145)

## 2023-01-26 MED ORDER — ALUM & MAG HYDROXIDE-SIMETH 200-200-20 MG/5ML PO SUSP
30.0000 mL | Freq: Four times a day (QID) | ORAL | Status: DC | PRN
  Administered 2023-01-26: 30 mL via ORAL
  Filled 2023-01-26: qty 30

## 2023-01-26 MED ORDER — ADULT MULTIVITAMIN W/MINERALS CH
1.0000 | ORAL_TABLET | Freq: Every day | ORAL | Status: DC
  Administered 2023-01-27 – 2023-01-30 (×4): 1 via ORAL
  Filled 2023-01-26 (×4): qty 1

## 2023-01-26 NOTE — Plan of Care (Signed)
  Problem: Education: Goal: Knowledge of General Education information will improve Description: Including pain rating scale, medication(s)/side effects and non-pharmacologic comfort measures Outcome: Progressing   Problem: Coping: Goal: Level of anxiety will decrease Outcome: Progressing   Problem: Elimination: Goal: Will not experience complications related to urinary retention Outcome: Progressing   Problem: Pain Management: Goal: General experience of comfort will improve Outcome: Progressing   Problem: Safety: Goal: Ability to remain free from injury will improve Outcome: Progressing   Problem: Skin Integrity: Goal: Risk for impaired skin integrity will decrease Outcome: Progressing

## 2023-01-26 NOTE — Evaluation (Signed)
Occupational Therapy Evaluation Patient Details Name: Hayley Wilson MRN: [id number] DOB: [birth year] Today's Date: 01/26/2023   History of Present Illness 88 yr old female admitted with sepsis, UTI, weakness. PMH: asthma, bladder mass with mets, ORIF R LE   Clinical Impression   The pt is currently presenting significantly below her baseline level of functioning for self-care management, as she is limited by the below listed deficits (see OT problem list). She is also noted to be general deconditioning. During the session today, she required significant assistance for bed mobility, lower body dressing, sit to stand using a RW, and for standing lateral steps along the EOB using a RW. She presented with decreased standing tolerance and unsteadiness in standing. She reported intermittent dizziness with progressive activity. Without further OT services, she is at risk for restricted ADL participation & further weakness and deconditioning. Patient will benefit from continued inpatient follow up therapy, <3 hours/day.       If plan is discharge home, recommend the following: A lot of help with walking and/or transfers;A lot of help with bathing/dressing/bathroom;Assistance with cooking/housework;Direct supervision/assist for medications management    Functional Status Assessment  Patient has had a recent decline in their functional status and demonstrates the ability to make significant improvements in function in a reasonable and predictable amount of time.  Equipment Recommendations  BSC/3in1    Recommendations for Other Services       Precautions / Restrictions Precautions Precautions: Fall Restrictions Weight Bearing Restrictions: No      Mobility Bed Mobility Overal bed mobility: Needs Assistance Bed Mobility: Supine to Sit     Supine to sit: Mod assist, HOB elevated, Used rails Sit to supine: Mod assist (required assist for BLE)   General bed mobility comments: required cues  to advance her BLE forward off bed, as well as reach and pull on bed rail; pt required assist for trunk    Transfers Overall transfer level: Needs assistance Equipment used: Rolling walker (2 wheels) Transfers: Sit to/from Stand Sit to Stand: Mod assist, From elevated surface           General transfer comment: pt performed 2 sit to stand transfers from EOB using a RW, requiring cues for hand placement, BLE positioning on floor and trunk extension in standing; after 2nd stand, she used the RW to take 3 lateral steps towards the [facility], requiring max assist with verbal and tactile cues for step sequencing and walker advancement      Balance     Sitting balance-Leahy Scale: Fair       Standing balance-Leahy Scale: Poor          ADL either performed or assessed with clinical judgement   ADL Overall ADL's : Needs assistance/impaired Eating/Feeding: Set up;Bed level Eating/Feeding Details (indicate cue type and reason): based on clinical judgement Grooming: Set up;Sitting Grooming Details (indicate cue type and reason): simulated seated EOB         Upper Body Dressing : Minimal assistance;Sitting Upper Body Dressing Details (indicate cue type and reason): simulated seated EOB Lower Body Dressing: Maximal assistance Lower Body Dressing Details (indicate cue type and reason): for sock management seated EOB     Toileting- Clothing Manipulation and Hygiene: Maximal assistance Toileting - Clothing Manipulation Details (indicate cue type and reason): based on clinical judgement                       Pertinent Vitals/Pain Pain Assessment Pain Assessment: No/denies pain  Extremity/Trunk Assessment Upper Extremity Assessment Upper Extremity Assessment: Right hand dominant;Generalized weakness   Lower Extremity Assessment Lower Extremity Assessment: Generalized weakness       Communication Communication Communication: No apparent difficulties   Cognition  Arousal: Alert Behavior During Therapy: WFL for tasks assessed/performed          General Comments: Oriented x4, able to follow 1 step commands consistently                Home Living Family/patient expects to be discharged to:: Unsure Living Arrangements: Other (Comment) (She reported having a daughter who stays with her occasionally.)   Type of Home: Apartment       Home Layout: One level               Home Equipment: Standard Walker;Cane - single point;Tub bench          Prior Functioning/Environment               Mobility Comments: She used a standard walker for ambulation inside the home and a cane when outside the home. ADLs Comments: She reported being modified independent to independent with ADLs, except for needing occasional assist for toileting. Her son performed the cooking, cleaning, and driving.        OT Problem List: Decreased strength;Decreased activity tolerance;Impaired balance (sitting and/or standing);Decreased knowledge of use of DME or AE      OT Treatment/Interventions: Self-care/ADL training;Therapeutic exercise;Energy conservation;DME and/or AE instruction;Therapeutic activities;Balance training;Patient/family education    OT Goals(Current goals can be found in the care plan section) Acute Rehab OT Goals Patient Stated Goal: "to get out of here" OT Goal Formulation: With patient Time For Goal Achievement: 02/09/23 Potential to Achieve Goals: Good ADL Goals Pt Will Perform Grooming: with set-up;sitting Pt Will Perform Upper Body Dressing: with set-up;sitting Pt Will Perform Lower Body Dressing: with contact guard assist;sit to/from stand;sitting/lateral leans Pt Will Transfer to Toilet: with contact guard assist;bedside commode;stand pivot transfer Pt Will Perform Toileting - Clothing Manipulation and hygiene: with contact guard assist;sit to/from stand  OT Frequency: Min 1X/week       AM-PAC OT "6 Clicks" Daily Activity      Outcome Measure Help from another person eating meals?: None Help from another person taking care of personal grooming?: A Little Help from another person toileting, which includes using toliet, bedpan, or urinal?: A Lot Help from another person bathing (including washing, rinsing, drying)?: A Lot Help from another person to put on and taking off regular upper body clothing?: A Little Help from another person to put on and taking off regular lower body clothing?: A Lot 6 Click Score: 16   End of Session Equipment Utilized During Treatment: Gait belt;Rolling walker (2 wheels) Nurse Communication: Mobility status  Activity Tolerance: Other (comment) (Fair+ tolerance) Patient left: in bed;with call bell/phone within reach;with family/visitor present  OT Visit Diagnosis: Unsteadiness on feet (R26.81);Other abnormalities of gait and mobility (R26.89);Muscle weakness (generalized) (M62.81)                Time: 2440-1027 OT Time Calculation (min): 33 min Charges:  OT General Charges $OT Visit: 1 Visit OT Evaluation $OT Eval Moderate Complexity: 1 Mod OT Treatments $Therapeutic Activity: 8-22 mins    Reuben Likes, OTR/L 01/26/2023, 3:42 PM

## 2023-01-26 NOTE — Progress Notes (Signed)
PROGRESS NOTE    Hayley Wilson  ZHY:[id number] DOB: 21-Sep-1934 DOA: 01/23/2023 PCP: Oneita Hurt, No   Brief Narrative:  Hayley Wilson is a 88 y.o. African-American female female with medical history significant for hypertension and recently treated UTI as well as presumed bladder malignancy seen on CT scan 10/9 at [facility] ER being admitted to the hospital with recurrent UTI, sepsis with acute renal failure. Patient continues to lose weight (reports 25lb weight loss in the last 90 days) - has become more dependent on family for ADLs. Recently completed a course of Keflex for presumed UTI after visit to [facility] ED.  She was told that she would be contacted by oncology office to schedule outpatient follow-up, but never received contact from them.  Subsequently she presented with tachycardia leukocytosis and was found to have another suspected UTI.  She is slowly improving PT OT recommending SNF.  Underwent a liver biopsy for her metastatic liver lesions allergy still pending.  Assessment and Plan:  Severe Sepsis secondary to UTI, POA -Presented with Tachycardia, leukocytosis, with source is UTI.  She has associated AKI. -Urinalysis done on admission showed a cloudy appearance with large bilirubin, amber-colored, small glucose, 15 ketones, negative leukocytes, positive nitrites with many bacteria and 11-20 RBCs per high-power field and 6-10 WBCs -Inpatient admission to progressive -WBC Trend: Recent Labs  Lab 01/23/23 1001 01/24/23 0445 01/25/23 0412 01/26/23 0441  WBC 26.3* 18.0* 12.0* 9.8  -Continuing empiric IV ceftriaxone and urine culture showed no growth -Blood cultures x 2 showing no growth to date at 3 days -PT OT recommending SNF and patient is considering SNF   AKI secondary to ATN/sepsis vs pre-renal/poor PO intake -BUN/Cr Trend: Recent Labs  Lab 01/23/23 1020 01/24/23 0445 01/25/23 0412 01/26/23 0441  BUN 32* 27* 25* 20  CREATININE 1.30* 0.83 0.73 0.50  -Patient received IV  fluids in the ED -Liberalize diet, hold further IV fluid for left indicated given national shortage -Avoid Nephrotoxic Medications, Contrast Dyes, Hypotension and Dehydration to Ensure Adequate Renal Perfusion and will need to Renally Adjust Meds -Continue to Monitor and Trend Renal Function carefully and repeat CMP in the AM   Bladder mass with evidence of metastasis; recent diagnosis of metastatic bladder cancer -Noted lymphadenopathy, bony and spinal mets -IR consulted at intake for potential biopsy, liver ultrasound done and shows multiple hypoechoic lesions up to 3 cm -18-gauge core biopsy x 2 right lobe liver mass 10/29 -Follow-up with oncology either here versus at [facility], unclear if she was ever contacted by previous facility.   Hypertension -Holding home medications -currently normotensive -Continue to Monitor BP per Protocol -Last BP reading was 111/71   Dysphagia, Odynophagia, new Questionable reflux versus regurgitation -Passed speech evaluation, advance diet as tolerated -Will consider outpatient endoscopy if symptoms worsen  Normocytic Anemia -Hgb/Hct Trend: Recent Labs  Lab 01/23/23 1001 01/24/23 0445 01/25/23 0412 01/26/23 0441  HGB 12.4 9.5* 9.3* 9.3*  HCT 40.2 31.7* 30.6* 31.6*  MCV 84.6 87.3 86.7 88.5  -Check Anemia Panel in the AM -Continue to Monitor for S/Sx of Bleeding; No overt bleeding noted  -Repeat CBC in the AM  Hypokalemia, improved  -Patient's K+ Level Trend: Recent Labs  Lab 01/23/23 1020 01/24/23 0445 01/25/23 0412 01/26/23 0441  K 4.0 3.3* 3.1* 3.8  -Continue to Monitor and Replete as Necessary -Repeat CMP in the AM    Ambulatory Dysfunction with Associated Generalized weakness and Physical Decondition Moderate to severe protein caloric malnutrition -Likely in the setting of metastatic malignancy -  PT OT following and recommending SNF; Patient to decide about SNF vs going home with Home Health -TOC consulted for possible  placement/needs at home   DVT prophylaxis: heparin injection 5,000 Units Start: 01/23/23 1600 SCDs Start: 01/23/23 1512    Code Status: Full Code Family Communication: Family present at bedside   Disposition Plan:  Level of care: Progressive Status is: Inpatient Remains inpatient appropriate because: Needs further clinical improvement and PT OT recommending SNF and she has been faxed for insurance authorization and bed availability   Consultants:  None  Procedures:  As delineated as above  Antimicrobials:  Anti-infectives (From admission, onward)    Start     Dose/Rate Route Frequency Ordered Stop   01/23/23 1530  cefTRIAXone (ROCEPHIN) 1 g in sodium chloride 0.9 % 100 mL IVPB  Status:  Discontinued        1 g 200 mL/hr over 30 Minutes Intravenous Every 24 hours 01/23/23 1439 01/23/23 1440   01/23/23 1100  cefTRIAXone (ROCEPHIN) 2 g in sodium chloride 0.9 % 100 mL IVPB        2 g 200 mL/hr over 30 Minutes Intravenous Every 24 hours 01/23/23 1058 01/30/23 0959       Subjective: Seen and examined at bedside and thinks she is doing little bit better today.  Still feels weak.  States her swallowing is doing better.  No nausea or vomiting but states that she is spitting up some phlegm.  No other concerns or complaints at this time.  Objective: Vitals:   01/25/23 1244 01/25/23 2007 01/26/23 0427 01/26/23 1240  BP: 117/77 132/87 104/75 111/71  Pulse: 77 87 87 90  Resp: 20 16 15 20   Temp: 98.5 F (36.9 C) 97.8 F (36.6 C) 97.7 F (36.5 C) 98.5 F (36.9 C)  TempSrc: Oral Oral Oral Oral  SpO2: 98% 98% 99% 99%  Weight:      Height:        Intake/Output Summary (Last 24 hours) at 01/26/2023 1405 Last data filed at 01/26/2023 0645 Gross per 24 hour  Intake 180 ml  Output 510 ml  Net -330 ml   Filed Weights   01/23/23 1000 01/23/23 1057  Weight: 69.2 kg 69 kg   Examination: Physical Exam:  Constitutional: WN/WD elderly over weight African-American female in no  acute distress Respiratory: Diminished to auscultation bilaterally, no wheezing, rales, rhonchi or crackles. Normal respiratory effort and patient is not tachypenic. No accessory muscle use.  Unlabored breathing Cardiovascular: RRR, no murmurs / rubs / gallops. S1 and S2 auscultated.  Trace extremity edema Abdomen: Soft, non-tender, distended secondary to body habitus. Bowel sounds positive.  GU: Deferred. Musculoskeletal: No clubbing / cyanosis of digits/nails. No joint deformity upper and lower extremities. Skin: No rashes, lesions, ulcers on limited skin evaluation. No induration; Warm and dry.  Neurologic: CN 2-12 grossly intact with no focal deficits. Romberg sign and cerebellar reflexes not assessed.  Psychiatric: Normal judgment and insight. Alert and oriented x 3. Normal mood and appropriate affect.   Data Reviewed: I have personally reviewed following labs and imaging studies  CBC: Recent Labs  Lab 01/23/23 1001 01/24/23 0445 01/25/23 0412 01/26/23 0441  WBC 26.3* 18.0* 12.0* 9.8  NEUTROABS 23.7*  --   --   --   HGB 12.4 9.5* 9.3* 9.3*  HCT 40.2 31.7* 30.6* 31.6*  MCV 84.6 87.3 86.7 88.5  PLT 265 218 195 195   Basic Metabolic Panel: Recent Labs  Lab 01/23/23 1020 01/24/23 0445 01/25/23 0412 01/26/23  0441  NA 139 139 144 140  K 4.0 3.3* 3.1* 3.8  CL 94* 102 106 105  CO2 25 27 27 26   GLUCOSE 109* 78 86 78  BUN 32* 27* 25* 20  CREATININE 1.30* 0.83 0.73 0.50  CALCIUM 10.0 9.0 9.2 8.8*   GFR: Estimated Creatinine Clearance: 48.1 mL/min (by C-G formula based on SCr of 0.5 mg/dL). Liver Function Tests: Recent Labs  Lab 01/23/23 1020  AST 31  ALT 15  ALKPHOS 95  BILITOT 1.2  PROT 8.0  ALBUMIN 2.7*   No results for input(s): "LIPASE", "AMYLASE" in the last 168 hours. No results for input(s): "AMMONIA" in the last 168 hours. Coagulation Profile: Recent Labs  Lab 01/25/23 0412  INR 1.1   Cardiac Enzymes: No results for input(s): "CKTOTAL", "CKMB",  "CKMBINDEX", "TROPONINI" in the last 168 hours. BNP (last 3 results) No results for input(s): "PROBNP" in the last 8760 hours. HbA1C: No results for input(s): "HGBA1C" in the last 72 hours. CBG: Recent Labs  Lab 01/23/23 1049  GLUCAP 95   Lipid Profile: No results for input(s): "CHOL", "HDL", "LDLCALC", "TRIG", "CHOLHDL", "LDLDIRECT" in the last 72 hours. Thyroid Function Tests: No results for input(s): "TSH", "T4TOTAL", "FREET4", "T3FREE", "THYROIDAB" in the last 72 hours. Anemia Panel: No results for input(s): "VITAMINB12", "FOLATE", "FERRITIN", "TIBC", "IRON", "RETICCTPCT" in the last 72 hours. Sepsis Labs: Recent Labs  Lab 01/23/23 1008 01/23/23 1626  LATICACIDVEN 1.5 1.9   Recent Results (from the past 240 hour(s))  Urine Culture     Status: None   Collection Time: 01/23/23 10:01 AM   Specimen: Urine, Catheterized  Result Value Ref Range Status   Specimen Description   Final    URINE, CATHETERIZED Performed at [facility], [address] [place], [place] [address]    Special Requests   Final    NONE Performed at [facility], [address] [place], [place] [address]    Culture   Final    NO GROWTH Performed at [facility], [address], [place], [place] [address]    Report Status 01/24/2023 FINAL  Final  Culture, blood (routine x 2)     Status: None (Preliminary result)   Collection Time: 01/23/23 10:20 AM   Specimen: BLOOD  Result Value Ref Range Status   Specimen Description   Final    BLOOD LEFT ANTECUBITAL Performed at [facility], [address] [place], [place] [address]    Special Requests   Final    BOTTLES DRAWN AEROBIC AND ANAEROBIC Blood Culture adequate volume Performed at [facility], [address] [place], [place] [address]    Culture   Final    NO GROWTH 3 DAYS Performed at [facility], [address], [place], [place] [address]    Report Status PENDING  Incomplete   Culture, blood (routine x 2)     Status: None (Preliminary result)   Collection Time: 01/23/23 10:20 AM   Specimen: BLOOD LEFT FOREARM  Result Value Ref Range Status   Specimen Description   Final    BLOOD LEFT FOREARM Performed at [facility], [address] [place], [place] [address]    Special Requests   Final    BOTTLES DRAWN AEROBIC ONLY Blood Culture results may not be optimal due to an inadequate volume of blood received in culture bottles Performed at [facility], [address]  [place], [place] [address]    Culture   Final    NO GROWTH 3 DAYS Performed at [facility], [address], [place], [place] [address]    Report Status PENDING  Incomplete    Radiology Studies: IR US LIVER BIOPSY  Result Date: 01/25/2023 INDICATION: 88 year old female history of liver mass is indeterminate EXAM: ULTRASOUND GUIDED LIVER LESION BIOPSY COMPARISON:  None Available. MEDICATIONS: None ANESTHESIA/SEDATION: Moderate (conscious) sedation was employed during this procedure. A total of Versed 0.5 mg and Fentanyl 25 mcg was administered intravenously. Moderate Sedation Time: 10 minutes. The patient's level of consciousness and vital signs were monitored continuously by radiology nursing throughout the procedure under my direct supervision. COMPLICATIONS: None immediate. PROCEDURE: Informed written consent was obtained from the patient after a discussion of the risks, benefits and alternatives to treatment. The patient understands and consents the procedure. A timeout was performed prior to the initiation of the procedure. Ultrasound scanning was performed of the right upper abdominal quadrant demonstrates few scattered hypoattenuating cyst in the right lobe, the largest measuring approximately 1 cm the centra aspect of the right lobe. L The right lobe mass was selected for biopsy and the procedure was planned. The right upper abdominal quadrant was prepped and draped in  the usual sterile fashion. The overlying soft tissues were anesthetized with 1% lidocaine with epinephrine. A 17 gauge, 6.8 cm co-axial needle was advanced into a peripheral aspect of the lesion. This was followed by 2 core biopsies with an 18 gauge core device under direct ultrasound guidance. The coaxial needle tract was embolized with a small amount of Gel-Foam slurry and superficial hemostasis was obtained with manual compression. Post procedural scanning was negative for definitive area of hemorrhage or additional complication. A dressing was placed. The patient tolerated the procedure well without immediate post procedural complication. IMPRESSION: Technically successful ultrasound guided core needle biopsy of right lobe liver mass. Marliss Coots, MD [facility] Radiology Electronically Signed   By: Marliss Coots M.D.   On: 01/25/2023 13:34    Scheduled Meds:  feeding supplement  237 mL Oral TID BM   heparin  5,000 Units Subcutaneous Q8H   potassium chloride  40 mEq Oral Once   Continuous Infusions:  cefTRIAXone (ROCEPHIN)  IV 2 g (01/26/23 0950)   lactated ringers      LOS: 3 days   Marguerita Merles, DO [facility] Available via Epic secure chat 7am-7pm After these hours, please refer to coverage provider listed on [email or web address] 01/26/2023, 2:05 PM

## 2023-01-26 NOTE — [place] FL2 (Signed)
[place] MEDICAID FL2 LEVEL OF CARE FORM     IDENTIFICATION  Patient Name: Hayley Wilson Birthdate: 02/01/1935 Sex: female Admission Date (Current Location): 01/23/2023  [facility] and [place] Number:  Producer, television/film/video and Address:  [facility],  [address] [place], [place] [address]      Provider Number: [id number]  Attending Physician Name and Address:  Merlene Laughter, DO  Relative Name and Phone Number:       Current Level of Care: Hospital Recommended Level of Care: Skilled Nursing Facility Prior Approval Number:    Date Approved/Denied:   PASRR Number: [id number]  Discharge Plan: SNF    Current Diagnoses: Patient Active Problem List   Diagnosis Date Noted   Sepsis (HCC) 01/23/2023   Closed pelvic fracture (HCC) 01/04/2014   HTN (hypertension) 04/09/2013   Glaucoma 04/09/2013   Obesity (BMI 30-39.9) 04/09/2013    Orientation RESPIRATION BLADDER Height & Weight     Self, Time, Situation, Place  Normal Incontinent Weight: 152 lb 1.6 oz (69 kg) Height:  5\' 4"  (162.6 cm)  BEHAVIORAL SYMPTOMS/MOOD NEUROLOGICAL BOWEL NUTRITION STATUS      Incontinent Diet (DYS 2)  AMBULATORY STATUS COMMUNICATION OF NEEDS Skin   Extensive Assist Verbally Normal                       Personal Care Assistance Level of Assistance  Bathing, Feeding, Dressing Bathing Assistance: Limited assistance Feeding assistance: Independent Dressing Assistance: Limited assistance     Functional Limitations Info  Sight, Hearing, Speech Sight Info: Adequate (reading glasses) Hearing Info: Adequate Speech Info: Adequate    SPECIAL CARE FACTORS FREQUENCY  PT (By licensed PT), OT (By licensed OT)     PT Frequency: 5 x a week OT Frequency: 5 x a week            Contractures Contractures Info: Not present    Additional Factors Info  Code Status, Allergies Code Status Info: full Allergies Info: Allopurinol  Atenolol  Hydrochlorothiazide  Amlodipine   Lisinopril           Current Medications (01/26/2023):  This is the current hospital active medication list Current Facility-Administered Medications  Medication Dose Route Frequency Provider Last Rate Last Admin   acetaminophen (TYLENOL) tablet 650 mg  650 mg Oral Q6H PRN Kirby Crigler, Mir M, MD       Or   acetaminophen (TYLENOL) suppository 650 mg  650 mg Rectal Q6H PRN Kirby Crigler, Mir M, MD       albuterol (PROVENTIL) (2.5 MG/3ML) 0.083% nebulizer solution 2.5 mg  2.5 mg Nebulization Q2H PRN Kirby Crigler, Mir M, MD       cefTRIAXone (ROCEPHIN) 2 g in sodium chloride 0.9 % 100 mL IVPB  2 g Intravenous Q24H Jacalyn Lefevre, MD 200 mL/hr at 01/26/23 0950 2 g at 01/26/23 0950   feeding supplement (ENSURE ENLIVE / ENSURE PLUS) liquid 237 mL  237 mL Oral TID BM Kirby Crigler, Mir M, MD   237 mL at 01/26/23 0951   heparin injection 5,000 Units  5,000 Units Subcutaneous Q8H Kirby Crigler, Mir M, MD   5,000 Units at 01/26/23 0865   hydrALAZINE (APRESOLINE) injection 5 mg  5 mg Intravenous Q6H PRN Kirby Crigler, Mir M, MD       lactated ringers bolus 250 mL  250 mL Intravenous Once Jacalyn Lefevre, MD       ondansetron [facility]) tablet 4 mg  4 mg Oral Q6H PRN Maryln Gottron, MD  Or   ondansetron (ZOFRAN) injection 4 mg  4 mg Intravenous Q6H PRN Kirby Crigler, Mir M, MD       potassium chloride SA (KLOR-CON M) CR tablet 40 mEq  40 mEq Oral Once Azucena Fallen, MD       traZODone (DESYREL) tablet 25 mg  25 mg Oral QHS PRN Maryln Gottron, MD   25 mg at 01/25/23 2105     Discharge Medications: Please see discharge summary for a list of discharge medications.  Relevant Imaging Results:  Relevant Lab Results:   Additional Information SSN :[id number]  Valentina Shaggy Volanda Mangine, LCSW

## 2023-01-26 NOTE — TOC Progression Note (Addendum)
Transition of Care [facility]) - Progression Note    Patient Details  Name: Hayley Wilson MRN: [id number] Date of Birth: Sep 03, 1934  Transition of Care [facility]) CM/SW Contact  Larrie Kass, LCSW Phone Number: 01/26/2023, 11:20 AM  Clinical Narrative:     CSW met with pt at bedside, pt lives with her two adult children. CSW discussed recommendations for SNF placement. Pt stated she thinks she would do well at home if she can get a wheelchair. CSW explained the difference between SNF and home health. Pt has agreed for her information to be sent out to review bed offers and then make her decision weather she would like to go to rehab. CSW to work pt up for SNF placement. TOC to follow.   ADDEN CSW presented bed offers, with medicare.Gov star ratings. Pt is requesting time to review. TOC to folllow.  Expected Discharge Plan: Skilled Nursing Facility Barriers to Discharge: Continued Medical Work up  Expected Discharge Plan and Services       Living arrangements for the past 2 months: Single Family Home                                       Social Determinants of Health (SDOH) Interventions SDOH Screenings   Social Connections: Unknown (04/26/2022)   Received from [facility]  Tobacco Use: Low Risk  (01/23/2023)    Readmission Risk Interventions     No data to display

## 2023-01-26 NOTE — Hospital Course (Addendum)
Hayley Wilson is a 88 y.o. African-American female female with medical history significant for hypertension and recently treated UTI as well as presumed bladder malignancy seen on CT scan 10/9 at [facility] health ER being admitted to the hospital with recurrent UTI, sepsis with acute renal failure. Patient continues to lose weight (reports 25lb weight loss in the last 90 days) - has become more dependent on family for ADLs. Recently completed a course of Keflex for presumed UTI after visit to [facility] ED.  She was told that she would be contacted by oncology office to schedule outpatient follow-up, but never received contact from them.  Subsequently she presented with tachycardia leukocytosis and was found to have another suspected UTI.  She is slowly improving PT OT recommending SNF.  Underwent a liver biopsy for her metastatic liver lesions and Biopsy Result still pending.  Overnight she started having Bloody stools and some hematuria but Hbg Stable.  GI and urology were consulted but patient declined any endoscopic procedures so GI signed off the case.  She also had some transient A-fib with RVR cardiology consulted At the case given her poor prognosis they are all recommending transitioning to hospice.  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  They are recommending a palliative care consult with placement in SNF with further hospice care at a later stage.  Assessment and Plan:  Severe Sepsis secondary to UTI, POA -Presented with Tachycardia, leukocytosis, with source is UTI.  She has associated AKI. -Urinalysis done on admission showed a cloudy appearance with large bilirubin, amber-colored, small glucose, 15 ketones, negative leukocytes, positive nitrites with many bacteria and 11-20 RBCs per high-power field and 6-10 WBCs -Inpatient admission to progressive -WBC Trend: Recent Labs   Lab 01/23/23 1001 01/24/23 0445 01/25/23 0412 01/26/23 0441 01/27/23 0413 01/28/23 0414  WBC 26.3* 18.0* 12.0* 9.8 9.1 7.6  -Continuing empiric IV ceftriaxone and urine culture showed no growth -Blood cultures x 2 showing no growth to date at 3 days -PT OT recommending SNF and patient is considering SNF and 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 -Will need further goals of care discussion given her metastatic disease and with extremely poor prognosis and oncology is recommending hospice and palliative care at SNF   AKI secondary to ATN/sepsis vs pre-renal/poor PO intake -BUN/Cr Trend: Recent Labs  Lab 01/23/23 1020 01/24/23 0445 01/25/23 0412 01/26/23 0441 01/27/23 0413 01/28/23 0414  BUN 32* 27* 25* 20 19 17   CREATININE 1.30* 0.83 0.73 0.50 0.49 0.38*  -Patient received IV fluids in the ED -Liberalize diet, hold further IV fluid for left indicated given national shortage -Avoid Nephrotoxic Medications, Contrast Dyes, Hypotension and Dehydration to Ensure Adequate Renal Perfusion and will need to Renally Adjust Meds -Continue to Monitor and Trend Renal Function carefully and repeat CMP in the AM   Transient A Fib with RVR -Troponin mildly flat and now normalized -Echo done -Cardiology recommends no anticoagulation at this time given hematuria and GI bleeding but have placed her on metoprolol 12.5 mg p.o. twice daily for better rate control -Given her prognosis they do not recommend aggressive workup -Cardiology has now signed off the case  Bladder mass with evidence of metastasis; ? diagnosis of metastatic bladder cancer vs other etiology highly suspicious for metastatic uterine cancer -Noted lymphadenopathy, bony and spinal mets -IR consulted at intake for potential biopsy, liver ultrasound done and shows multiple hypoechoic lesions up  to 3 cm -Liver Bx done and showed metastatic carcinoma, high-grade serous of gynecologic origin  -18-gauge core biopsy  x 2 right lobe liver mass 10/29 -Now having some Hematuria as well so will check a U/A and may need to speak with Urology  -Medical Oncology Consulted and they feel that her cancer is incurable and explained to the patient why surgery and radiation was not indicated and they recommended given her frail status that chemotherapy is not recommended; Dr. Lucia Estelle also discussed the prognosis with the patient without treatment and recommends palliative care consult and placement in a skilled residential facility given that the family cannot take care of her health situation at this time. -Palliative care medicine consulted for further goals of care discussion   Hypertension -Holding home medications -currently normotensive -Continue to Monitor BP per Protocol -Last BP reading was 132/94  Hematuria -Urinalysis done and showed a cloudy appearance with amber color urine, negative glucose, large hemoglobin, 5 ketones, large leukocytes, negative nitrites, many bacteria, 0-5 non-squamous epithelial cells, current 50 RBCs per high-power field and greater than 50 WBCs -Discontinued her subcu Lovenox -Vomiting of bladder as well as metastatic disease and they feel symptoms are coming from a bladder tumor and if she has undiagnosed hemorrhagic cystitis that she is already receiving antibiotics -They are performing a string of bottles to see him out hematuria that she is having -They are recommending observation at this point only  Hypophosphatemia -Phos Level Trend: Recent Labs  Lab 01/28/23 0414  PHOS 1.5*  -Repelte -Continue to Monitor and Trend and repeat Phos in the AM  Maroon Stools -Hemoglobin is trending downwards.  FOBT is pending. -GI consulted for further evaluation and given that the patient has declined endoscopic intervention as they have signed off the case and recommending the patient is hospice   Dysphagia, Odynophagia, new Questionable reflux versus regurgitation -Passed speech  evaluation, advance diet as tolerated -GI consulted for GI bleeding as above -Currently patient is not interested in invasive procedures at this time  Normocytic Anemia -Now with Bloody stools overnight; Hgb/Hct Trend: Recent Labs  Lab 01/23/23 1001 01/24/23 0445 01/25/23 0412 01/26/23 0441 01/27/23 0413 01/27/23 0852 01/28/23 0414  HGB 12.4 9.5* 9.3* 9.3* 10.1* 9.7* 8.7*  HCT 40.2 31.7* 30.6* 31.6* 34.4* 32.8* 29.1*  MCV 84.6 87.3 86.7 88.5 89.4  --  88.7  -Checked Anemia Panel, UIBC 107, TIBC 129, saturation ratio 17%, ferritin level 583, folate level 5.5 and vitamin B12 451 -Check FOBT -Continue to Monitor for S/Sx of Bleeding; Now having Maroon stools -Repeat CBC in the AM  Hypokalemia, improved  -Patient's K+ Level Trend: Recent Labs  Lab 01/23/23 1020 01/24/23 0445 01/25/23 0412 01/26/23 0441 01/27/23 0413 01/28/23 0414  K 4.0 3.3* 3.1* 3.8 3.9 3.8  -Continue to Monitor and Replete as Necessary -Repeat CMP in the AM    Ambulatory Dysfunction with Associated Generalized weakness and Physical Decondition Moderate to severe protein caloric malnutrition -Likely in the setting of metastatic malignancy -PT OT following and recommending SNF; Patient to decide about SNF vs going home with Home Health and agreeable to consider SNF at [facility] consulted for possible placement/needs at home

## 2023-01-26 NOTE — Progress Notes (Signed)
Initial Nutrition Assessment  DOCUMENTATION CODES:   Non-severe (moderate) malnutrition in context of chronic illness  INTERVENTION:  - DYS 2 diet per SLP recs.  - Ensure Plus High Protein po TID, each supplement provides 350 kcal and 20 grams of protein. - Magic cup TID with meals, each supplement provides 290 kcal and 9 grams of protein - Encourage intake at all meals and of supplements.  - Add Multivitamin with minerals daily - Monitor weight trends.   NUTRITION DIAGNOSIS:   Moderate Malnutrition related to chronic illness as evidenced by moderate fat depletion, severe muscle depletion.  GOAL:   Patient will meet greater than or equal to 90% of their needs  MONITOR:   PO intake, Supplement acceptance, Diet advancement, Weight trends  REASON FOR ASSESSMENT:   Consult Assessment of nutrition requirement/status, Poor PO  ASSESSMENT:   88 y.o. female with PMH significant for HTN, presumed bladder malignancy seen on CT scan admitted to the hospital with recurrent UTI, sepsis with acute renal failure.  Patient endorses a UBW of 150# and weight loss over the past 3-4 months due to eating less secondary to poor appetite. Per EMR, no weight history within the past year to assess recent changes. Patient was weighed at 206# in June 2023 and now weighed at 152#. This is a 54# or 26% weight loss in 16 months. Could be significant if weight loss within the time frame patient provided.   Patient notes she eats 2 small meals a day at home. Her appetite has been decreased so she has been eating less. Drinking Ensure once a day but admits she skips some days.   Current appetite is even worse than it has been recently. Had a couple bites of her lunch and was done. Documented to be consuming 10% of meals. She is being followed by SLP and recommended a DYS 2 diet.  Discussed importance of trying to eat something at all 3 meals. She is agreeable to continue to receive Ensure and also likes  Magic Cup (butter pecan) and would like to continue receiving as well.    Medications reviewed and include: -  Labs reviewed:  -   NUTRITION - FOCUSED PHYSICAL EXAM:  Flowsheet Row Most Recent Value  Orbital Region Moderate depletion  Upper Arm Region No depletion  Thoracic and Lumbar Region No depletion  Buccal Region Severe depletion  Temple Region Severe depletion  Clavicle Bone Region Severe depletion  Clavicle and Acromion Bone Region Severe depletion  Scapular Bone Region Unable to assess  Dorsal Hand Moderate depletion  Patellar Region Moderate depletion  Anterior Thigh Region Moderate depletion  Posterior Calf Region Moderate depletion  Edema (RD Assessment) None  Hair Reviewed  Eyes Reviewed  Mouth Reviewed  Skin Reviewed  Nails Reviewed       Diet Order:   Diet Order             DIET DYS 2 Room service appropriate? Yes with Assist; Fluid consistency: Thin  Diet effective now                   EDUCATION NEEDS:  Education needs have been addressed  Skin:  Skin Assessment: Reviewed RN Assessment  Last BM:  10/29  Height:  Ht Readings from Last 1 Encounters:  01/23/23 5\' 4"  (1.626 m)   Weight:  Wt Readings from Last 1 Encounters:  01/23/23 69 kg    BMI:  Body mass index is 26.11 kg/m.  Estimated Nutritional Needs:  Kcal:  1750-1850 kcals Protein:  95-110 grams Fluid:  >/= 1.7L    Shelle Iron RD, LDN For contact information, refer to [facility].

## 2023-01-27 DIAGNOSIS — K921 Melena: Secondary | ICD-10-CM

## 2023-01-27 DIAGNOSIS — A419 Sepsis, unspecified organism: Secondary | ICD-10-CM | POA: Diagnosis not present

## 2023-01-27 DIAGNOSIS — E440 Moderate protein-calorie malnutrition: Secondary | ICD-10-CM

## 2023-01-27 DIAGNOSIS — C679 Malignant neoplasm of bladder, unspecified: Secondary | ICD-10-CM | POA: Diagnosis not present

## 2023-01-27 LAB — CBC
HCT: 34.4 % — ABNORMAL LOW (ref 36.0–46.0)
Hemoglobin: 10.1 g/dL — ABNORMAL LOW (ref 12.0–15.0)
MCH: 26.2 pg (ref 26.0–34.0)
MCHC: 29.4 g/dL — ABNORMAL LOW (ref 30.0–36.0)
MCV: 89.4 fL (ref 80.0–100.0)
Platelets: 176 10*3/uL (ref 150–400)
RBC: 3.85 MIL/uL — ABNORMAL LOW (ref 3.87–5.11)
RDW: 16.6 % — ABNORMAL HIGH (ref 11.5–15.5)
WBC: 9.1 10*3/uL (ref 4.0–10.5)
nRBC: 0 % (ref 0.0–0.2)

## 2023-01-27 LAB — URINALYSIS, COMPLETE (UACMP) WITH MICROSCOPIC
Bilirubin Urine: NEGATIVE
Glucose, UA: NEGATIVE mg/dL
Ketones, ur: 5 mg/dL — AB
Nitrite: NEGATIVE
Protein, ur: 100 mg/dL — AB
RBC / HPF: >50 RBC/hpf (ref 0–5)
Specific Gravity, Urine: 1.028 (ref 1.005–1.030)
WBC, UA: >50 WBC/hpf (ref 0–5)
pH: 5 (ref 5.0–8.0)

## 2023-01-27 LAB — BASIC METABOLIC PANEL
Anion gap: 8 (ref 5–15)
BUN: 19 mg/dL (ref 8–23)
CO2: 27 mmol/L (ref 22–32)
Calcium: 9 mg/dL (ref 8.9–10.3)
Chloride: 106 mmol/L (ref 98–111)
Creatinine, Ser: 0.49 mg/dL (ref 0.44–1.00)
GFR, Estimated: >60 mL/min (ref >60)
Glucose, Bld: 83 mg/dL (ref 70–99)
Potassium: 3.9 mmol/L (ref 3.5–5.1)
Sodium: 141 mmol/L (ref 135–145)

## 2023-01-27 LAB — TYPE AND SCREEN
ABO/RH(D): O POS
Antibody Screen: NEGATIVE

## 2023-01-27 LAB — HEMOGLOBIN AND HEMATOCRIT, BLOOD
HCT: 32.8 % — ABNORMAL LOW (ref 36.0–46.0)
Hemoglobin: 9.7 g/dL — ABNORMAL LOW (ref 12.0–15.0)

## 2023-01-27 MED ORDER — GERHARDT'S BUTT CREAM
TOPICAL_CREAM | Freq: Three times a day (TID) | CUTANEOUS | Status: DC
  Filled 2023-01-27: qty 1

## 2023-01-27 MED ORDER — CARMEX CLASSIC LIP BALM EX OINT
TOPICAL_OINTMENT | CUTANEOUS | Status: DC | PRN
  Administered 2023-01-27: 1 via TOPICAL
  Filled 2023-01-27: qty 10

## 2023-01-27 NOTE — Progress Notes (Signed)
OT Cancellation Note  Patient Details Name: Hayley Wilson MRN: [id number] DOB: 1935-02-24   Cancelled Treatment:    Reason Eval/Treat Not Completed: Other (comment). The pt was eating lunch and asked for therapy to check back later.   Reuben Likes 01/27/2023, 2:39 PM

## 2023-01-27 NOTE — Progress Notes (Signed)
Speech Language Pathology Treatment: Dysphagia  Patient Details Name: Hayley Wilson MRN: [id number] DOB: [birth year] Today's Date: 01/27/2023 Time: 7829-5621 SLP Time Calculation (min) (ACUTE ONLY): 25 min  Assessment / Plan / Recommendation Clinical Impression  Patient seen for follow-up dysphagia management. At this time is Hayley Wilson is alert, moderately dysarthric which appears to be exacerbated by xerostomia and leaning to the right side. After patient consumed water via straw articulation abilities improved significantly but patient advises that they are not at her baseline. No family present to establish history. Pt required assistance for repositioning to consume her lunch. Upon set up she was able to feed herself her meal including water via straw, mashed potatoes, carrots and roast.   Decreased labial seal noted with straw resulting in anterior spillage chronically but by the end of the session, she was able to produce adequate seal. Patient able to masticate soft solids without significant oral retention. She uses liquids to aid oral transit of solids but also advised she could use applesauce or pures if she notices liquids frequently cause her to choke. No clinical indication of aspiration during portion of meal consumed. The patient also observed to consistently expectorate thin secretions throughout session after swallowing water in particular -but also mixed with a trace amount of masticated carrots which patient said were retained in her oral cavity. Patient states she has issues with secretion retention - thus is reasoning for allowing anterior spillage of water and secretions to prevent her "choking".   SLP set up oral suction for her to use as needed with patient demonstrating adequate usage. She then requested for this to be sent with her upon discharge. Bloody tissue noted in emesis bag which patient reports is due to dry labial region and thus asked RN to order patient Carmex. No  complaint of regurgitation with any intake reported today. Hayley Wilson reports improved swallow function less pain with swallowing as well as being content with her current diet.   HPI HPI: Patient is an 88 y.o. female with PMH: HTN, recently treated UTI and presumed bladder malignancy seen on CT scan 01/05/23 at [facility] ER. Patient lives with his adopted grandson who is 9 years old and having an increasingly difficult time taking care of her. She presented to the hospital on 01/23/23 due to sore throat, difficulty swallowing; regurgitating food, and ongoing weight loss (unintentional loss of at least 25 pounds in past two months per patient report). In ED she was tachycardic, found to have UTI with associated AKI.      SLP Plan  Continue with current plan of care      Recommendations for follow up therapy are one component of a multi-disciplinary discharge planning process, led by the attending physician.  Recommendations may be updated based on patient status, additional functional criteria and insurance authorization.    Recommendations  Diet recommendations: Dysphagia 2 (fine chop);Thin liquid Liquids provided via: Cup;Straw Medication Administration: Whole meds with puree Supervision: Patient able to self feed Compensations: Slow rate;Small sips/bites;Monitor for anterior loss;Other (Comment) (Oral suction when patient's meal is completed) Postural Changes and/or Swallow Maneuvers: Seated upright 90 degrees;Upright 30-60 min after meal, please order oral suction for next venue of care per pt request.   (Leave oral suction within patient's reach at all times)                  Oral care BID     Dysphagia, oral phase (R13.11)     Continue with current plan of  care    Rolena Infante, MS [facility] SLP Acute Rehab Services Office [phone]  Chales Abrahams  01/27/2023, 3:55 PM

## 2023-01-27 NOTE — TOC Progression Note (Signed)
Transition of Care [facility]) - Progression Note    Patient Details  Name: Hayley Wilson MRN: [id number] Date of Birth: [birth year]  Transition [facility] Care [facility]) CM/SW Contact  Larrie Kass, LCSW Phone Number: 01/27/2023, 11:31 AM  Clinical Narrative:     Pt has chosen [facility] for SNF placement. CSW reached out to [place] admission with [facility] to start insurance authorization. TOC to follow.   Expected Discharge Plan: Skilled Nursing Facility Barriers to Discharge: Continued Medical Work up  Expected Discharge Plan and Services       Living arrangements for the past 2 months: Single Family Home                                       Social Determinants of Health (SDOH) Interventions SDOH Screenings   Social Connections: Unknown (04/26/2022)   Received from [facility]  Tobacco Use: Low Risk  (01/23/2023)    Readmission Risk Interventions     No data to display

## 2023-01-27 NOTE — Progress Notes (Signed)
Notified MD that patient was still having blood in stool for one occurrence during this shift. MD was also made aware that patient may be having blood coming from urethra. RN also reached out to MD to see if patient can get some barrier cream for buttocks as patient's buttocks is excoriated from bowel movements. MD placed new orders. See order history.

## 2023-01-27 NOTE — Progress Notes (Signed)
PROGRESS NOTE    Hayley Wilson  ZOX:[id number] DOB: Feb 22, 1935 DOA: 01/23/2023 PCP: Oneita Hurt, No   Brief Narrative:  Hayley Wilson is a 88 y.o. African-American female female with medical history significant for hypertension and recently treated UTI as well as presumed bladder malignancy seen on CT scan 10/9 at [facility] being admitted to the hospital with recurrent UTI, sepsis with acute renal failure. Patient continues to lose weight (reports 25lb weight loss in the last 90 days) - has become more dependent on family for ADLs. Recently completed a course of Keflex for presumed UTI after visit to [facility] ED.  She was told that she would be contacted by oncology office to schedule outpatient follow-up, but never received contact from them.  Subsequently she presented with tachycardia leukocytosis and was found to have another suspected UTI.  She is slowly improving PT OT recommending SNF.  Underwent a liver biopsy for her metastatic liver lesions and Biopsy Result still pending.  Overnight she started having Bloody stools and some hematuria but Hbg Stable so will need to continue to Monitor and discuss with GI and Urology in the AM  Assessment and Plan:  Severe Sepsis secondary to UTI, POA -Presented with Tachycardia, leukocytosis, with source is UTI.  She has associated AKI. -Urinalysis done on admission showed a cloudy appearance with large bilirubin, amber-colored, small glucose, 15 ketones, negative leukocytes, positive nitrites with many bacteria and 11-20 RBCs per high-power field and 6-10 WBCs -Inpatient admission to progressive -WBC Trend: Recent Labs  Lab 01/23/23 1001 01/24/23 0445 01/25/23 0412 01/26/23 0441 01/27/23 0413  WBC 26.3* 18.0* 12.0* 9.8 9.1  -Continuing empiric IV ceftriaxone and urine culture showed no growth -Blood cultures x 2 showing no growth to date at 3 days -PT OT recommending SNF and patient is considering SNF and has chosen [facility] but [employer] Authorization   AKI secondary to ATN/sepsis vs pre-renal/poor PO intake -BUN/Cr Trend: Recent Labs  Lab 01/23/23 1020 01/24/23 0445 01/25/23 0412 01/26/23 0441 01/27/23 0413  BUN 32* 27* 25* 20 19  CREATININE 1.30* 0.83 0.73 0.50 0.49  -Patient received IV fluids in the ED -Liberalize diet, hold further IV fluid for left indicated given national shortage -Avoid Nephrotoxic Medications, Contrast Dyes, Hypotension and Dehydration to Ensure Adequate Renal Perfusion and will need to Renally Adjust Meds -Continue to Monitor and Trend Renal Function carefully and repeat CMP in the AM   Bladder mass with evidence of metastasis; recent diagnosis of metastatic bladder cancer -Noted lymphadenopathy, bony and spinal mets -IR consulted at intake for potential biopsy, liver ultrasound done and shows multiple hypoechoic lesions up to 3 cm -18-gauge core biopsy x 2 right lobe liver mass 10/29 -Now having some Hematuria as well so will check a U/A and may need to speak with Urology  -Follow-up with oncology either here versus at [facility], unclear if she was ever contacted by previous facility.   Hypertension -Holding home medications -currently normotensive -Continue to Monitor BP per Protocol -Last BP reading was 132/94   Dysphagia, Odynophagia, new Questionable reflux versus regurgitation -Passed speech evaluation, advance diet as tolerated -Will consider outpatient endoscopy if symptoms worsen  Normocytic Anemia -Now with Bloody stools overnight; Hgb/Hct Trend: Recent Labs  Lab 01/23/23 1001 01/24/23 0445 01/25/23 0412 01/26/23 0441 01/27/23 0413 01/27/23 0852  HGB 12.4 9.5* 9.3* 9.3* 10.1* 9.7*  HCT 40.2 31.7* 30.6* 31.6* 34.4* 32.8*  MCV 84.6 87.3 86.7 88.5 89.4  --   -Check Anemia Panel in the  AM and is now Typed and Screened  -Check FOBT -Continue to Monitor for S/Sx of Bleeding; Now having Maroon stools -Repeat CBC in the AM  Hypokalemia, improved  -Patient's K+  Level Trend: Recent Labs  Lab 01/23/23 1020 01/24/23 0445 01/25/23 0412 01/26/23 0441 01/27/23 0413  K 4.0 3.3* 3.1* 3.8 3.9  -Continue to Monitor and Replete as Necessary -Repeat CMP in the AM    Ambulatory Dysfunction with Associated Generalized weakness and Physical Decondition Moderate to severe protein caloric malnutrition -Likely in the setting of metastatic malignancy -PT OT following and recommending SNF; Patient to decide about SNF vs going home with Home Health and agreeable to consider SNF at [facility] consulted for possible placement/needs at home   DVT prophylaxis: heparin injection 5,000 Units Start: 01/23/23 1600 SCDs Start: 01/23/23 1512    Code Status: Full Code Family Communication: No family present at bedside  Disposition Plan:  Level of care: Progressive Status is: Inpatient Remains inpatient appropriate because: Needs SNF and Insurance Authorization and also needs further evaluation of Maroon Stools and Hematuria   Consultants:  None  Procedures:  As delineated as above  Antimicrobials:  Anti-infectives (From admission, onward)    Start     Dose/Rate Route Frequency Ordered Stop   01/23/23 1530  cefTRIAXone (ROCEPHIN) 1 g in sodium chloride 0.9 % 100 mL IVPB  Status:  Discontinued        1 g 200 mL/hr over 30 Minutes Intravenous Every 24 hours 01/23/23 1439 01/23/23 1440   01/23/23 1100  cefTRIAXone (ROCEPHIN) 2 g in sodium chloride 0.9 % 100 mL IVPB        2 g 200 mL/hr over 30 Minutes Intravenous Every 24 hours 01/23/23 1058 01/30/23 0959       Subjective: Seen and examined at bedside and was fatigued and had some nausea.  No lightheadedness or dizziness.  Feels okay.  Still feels weak and is now having some bloody bowel movement overnight.  Also complaining of lower abdominal pain but has no other concerns or complaints at this time.  Objective: Vitals:   01/26/23 1240 01/26/23 2038 01/27/23 0630 01/27/23 1331  BP: 111/71 (!) 126/91  130/81 (!) 132/94  Pulse: 90 89 98 (!) 110  Resp: 20 16  18   Temp: 98.5 F (36.9 C) 97.8 F (36.6 C) 97.8 F (36.6 C) 97.8 F (36.6 C)  TempSrc: Oral Oral Oral Oral  SpO2: 99% 98% 100% 100%  Weight:      Height:        Intake/Output Summary (Last 24 hours) at 01/27/2023 1918 Last data filed at 01/27/2023 1817 Gross per 24 hour  Intake 580 ml  Output 300 ml  Net 280 ml   Filed Weights   01/23/23 1000 01/23/23 1057  Weight: 69.2 kg 69 kg   Examination: Physical Exam:  Constitutional: Thin chronically ill-appearing African-American female in no acute distress appears slightly uncomfortable Respiratory: Diminished to auscultation bilaterally, no wheezing, rales, rhonchi or crackles. Normal respiratory effort and patient is not tachypenic. No accessory muscle use.  Unlabored breathing Cardiovascular: RRR, no murmurs / rubs / gallops. S1 and S2 auscultated. No extremity edema.   Abdomen: Soft, little tender in the lower abdomen, non-distended. Bowel sounds positive.  GU: Deferred. Musculoskeletal: No clubbing / cyanosis of digits/nails. No joint deformity upper and lower extremities. Skin: No rashes, lesions, ulcers on a limited skin evaluation. No induration; Warm and dry.  Neurologic: CN 2-12 grossly intact with no focal deficits. Romberg sign and  cerebellar reflexes not assessed.  Psychiatric: Normal judgment and insight. Alert and oriented x 3. Normal mood and appropriate affect.   Data Reviewed: I have personally reviewed following labs and imaging studies  CBC: Recent Labs  Lab 01/23/23 1001 01/24/23 0445 01/25/23 0412 01/26/23 0441 01/27/23 0413 01/27/23 0852  WBC 26.3* 18.0* 12.0* 9.8 9.1  --   NEUTROABS 23.7*  --   --   --   --   --   HGB 12.4 9.5* 9.3* 9.3* 10.1* 9.7*  HCT 40.2 31.7* 30.6* 31.6* 34.4* 32.8*  MCV 84.6 87.3 86.7 88.5 89.4  --   PLT 265 218 195 195 176  --    Basic Metabolic Panel: Recent Labs  Lab 01/23/23 1020 01/24/23 0445  01/25/23 0412 01/26/23 0441 01/27/23 0413  NA 139 139 144 140 141  K 4.0 3.3* 3.1* 3.8 3.9  CL 94* 102 106 105 106  CO2 25 27 27 26 27   GLUCOSE 109* 78 86 78 83  BUN 32* 27* 25* 20 19  CREATININE 1.30* 0.83 0.73 0.50 0.49  CALCIUM 10.0 9.0 9.2 8.8* 9.0   GFR: Estimated Creatinine Clearance: 48.1 mL/min (by C-G formula based on SCr of 0.49 mg/dL). Liver Function Tests: Recent Labs  Lab 01/23/23 1020  AST 31  ALT 15  ALKPHOS 95  BILITOT 1.2  PROT 8.0  ALBUMIN 2.7*   No results for input(s): "LIPASE", "AMYLASE" in the last 168 hours. No results for input(s): "AMMONIA" in the last 168 hours. Coagulation Profile: Recent Labs  Lab 01/25/23 0412  INR 1.1   Cardiac Enzymes: No results for input(s): "CKTOTAL", "CKMB", "CKMBINDEX", "TROPONINI" in the last 168 hours. BNP (last 3 results) No results for input(s): "PROBNP" in the last 8760 hours. HbA1C: No results for input(s): "HGBA1C" in the last 72 hours. CBG: Recent Labs  Lab 01/23/23 1049  GLUCAP 95   Lipid Profile: No results for input(s): "CHOL", "HDL", "LDLCALC", "TRIG", "CHOLHDL", "LDLDIRECT" in the last 72 hours. Thyroid Function Tests: No results for input(s): "TSH", "T4TOTAL", "FREET4", "T3FREE", "THYROIDAB" in the last 72 hours. Anemia Panel: No results for input(s): "VITAMINB12", "FOLATE", "FERRITIN", "TIBC", "IRON", "RETICCTPCT" in the last 72 hours. Sepsis Labs: Recent Labs  Lab 01/23/23 1008 01/23/23 1626  LATICACIDVEN 1.5 1.9   Recent Results (from the past 240 hour(s))  Urine Culture     Status: None   Collection Time: 01/23/23 10:01 AM   Specimen: Urine, Catheterized  Result Value Ref Range Status   Specimen Description   Final    URINE, CATHETERIZED Performed at [facility], [address] [place], [place] [address]    Special Requests   Final    NONE Performed at [facility], [address] [place], [place] [address]    Culture   Final    NO GROWTH Performed  at [facility], [address] [place], [place] [address]    Report Status 01/24/2023 FINAL  Final  Culture, blood (routine x 2)     Status: None (Preliminary result)   Collection Time: 01/23/23 10:20 AM   Specimen: BLOOD  Result Value Ref Range Status   Specimen Description   Final    BLOOD LEFT ANTECUBITAL Performed at [facility], [address] [place], [place] [address]    Special Requests   Final    BOTTLES DRAWN AEROBIC AND ANAEROBIC Blood Culture adequate volume Performed at [facility], [address] [place], [place] [address]    Culture   Final    NO GROWTH 4 DAYS Performed at [facility], [address], [place], [place] [address]    Report Status PENDING  Incomplete  Culture, blood (routine x 2)     Status: None (Preliminary result)   Collection Time: 01/23/23 10:20 AM   Specimen: BLOOD LEFT FOREARM  Result Value Ref Range Status   Specimen Description   Final    BLOOD LEFT FOREARM Performed at [facility], [address] [place], [place] [address]    Special Requests   Final    BOTTLES DRAWN AEROBIC ONLY Blood Culture results may not be optimal due to an inadequate volume of blood received in culture bottles Performed at [facility], [address] [place], [place] [address]    Culture   Final    NO GROWTH 4 DAYS Performed at [facility], [address] [place], [place] [address]    Report Status PENDING  Incomplete    Radiology Studies: No results found.  Scheduled Meds:  feeding supplement  237 mL Oral TID BM   Gerhardt's butt cream   Topical TID   heparin  5,000 Units Subcutaneous Q8H   multivitamin with minerals  1 tablet Oral Daily   potassium chloride  40 mEq Oral Once   Continuous Infusions:  cefTRIAXone (ROCEPHIN)  IV 2 g (01/27/23 0935)   lactated ringers      LOS: 4 days   Marguerita Merles, DO [facility] Available via Epic secure chat 7am-7pm After these  hours, please refer to coverage provider listed on [email or web address] 01/27/2023, 7:18 PM

## 2023-01-27 NOTE — Progress Notes (Signed)
Occupational Therapy Treatment Patient Details Name: Hayley Wilson MRN: [id number] DOB: December 28, 1934 Today's Date: 01/27/2023   History of present illness 88 yr old female admitted with sepsis, UTI, weakness. Hx of asthma, bladder mass with mets, ORIF R LE   OT comments  Pt reported increased fatigue, stating she had a rough night last night and hasn't gotten much rest today. As such, she gently deferred attempts at out of bed activity. She was however amendable to instruction on therapeutic exercises at bed level for strengthening needed to facilitate improved ADL performance. She was instructed on B UE and B LE ROM and exercises, for which she performed 10 reps and 1 set of such exercises as bicep curls, ankle pumps, knee flexion/extension, hip abduction/adduction, and over head raises. She intermittently required active assist for B LE. She presented with fair+ tolerance. Continue OT plan of care. Patient will benefit from continued inpatient follow up therapy, <3 hours/day.       If plan is discharge home, recommend the following:  A lot of help with walking and/or transfers;A lot of help with bathing/dressing/bathroom;Assistance with cooking/housework;Direct supervision/assist for medications management   Equipment Recommendations  BSC/3in1    Recommendations for Other Services      Precautions / Restrictions Precautions Precautions: Fall Restrictions Weight Bearing Restrictions: No           ADL either performed or assessed with clinical judgement      Cognition Arousal: Alert Behavior During Therapy: WFL for tasks assessed/performed Overall Cognitive Status: Within Functional Limits for tasks assessed                      Pertinent Vitals/ Pain       Pain Assessment Pain Assessment: No/denies pain         Frequency  Min 1X/week        Progress Toward Goals  OT Goals(current goals can now be found in the care plan section)     Acute Rehab OT  Goals Patient Stated Goal: "to get out of here" OT Goal Formulation: With patient Time For Goal Achievement: 02/09/23 Potential to Achieve Goals: Good  Plan         AM-PAC OT "6 Clicks" Daily Activity     Outcome Measure   Help from another person eating meals?: None Help from another person taking care of personal grooming?: A Little Help from another person toileting, which includes using toliet, bedpan, or urinal?: A Lot Help from another person bathing (including washing, rinsing, drying)?: A Lot Help from another person to put on and taking off regular upper body clothing?: A Little Help from another person to put on and taking off regular lower body clothing?: A Lot 6 Click Score: 16    End of Session Equipment Utilized During Treatment: Other (comment) (N/A)  OT Visit Diagnosis: Unsteadiness on feet (R26.81);Other abnormalities of gait and mobility (R26.89);Muscle weakness (generalized) (M62.81)   Activity Tolerance Patient limited by fatigue   Patient Left in bed;with call bell/phone within reach;with bed alarm set   Nurse Communication Other (comment)        Time: 3244-0102 OT Time Calculation (min): 19 min  Charges: OT General Charges $OT Visit: 1 Visit OT Treatments $Therapeutic Activity: 8-22 mins     Reuben Likes, OTR/L 01/27/2023, 4:40 PM

## 2023-01-27 NOTE — Plan of Care (Signed)
  Problem: Education: Goal: Knowledge of General Education information will improve Description: Including pain rating scale, medication(s)/side effects and non-pharmacologic comfort measures Outcome: Progressing   Problem: Coping: Goal: Level of anxiety will decrease Outcome: Progressing   Problem: Pain Management: Goal: General experience of comfort will improve Outcome: Progressing   Problem: Safety: Goal: Ability to remain free from injury will improve Outcome: Progressing   Problem: Skin Integrity: Goal: Risk for impaired skin integrity will decrease Outcome: Progressing

## 2023-01-28 ENCOUNTER — Inpatient Hospital Stay (HOSPITAL_COMMUNITY): Payer: Medicare (Managed Care)

## 2023-01-28 ENCOUNTER — Encounter (HOSPITAL_COMMUNITY): Payer: Self-pay | Admitting: Internal Medicine

## 2023-01-28 DIAGNOSIS — C55 Malignant neoplasm of uterus, part unspecified: Secondary | ICD-10-CM | POA: Diagnosis not present

## 2023-01-28 DIAGNOSIS — I4891 Unspecified atrial fibrillation: Secondary | ICD-10-CM

## 2023-01-28 DIAGNOSIS — C579 Malignant neoplasm of female genital organ, unspecified: Secondary | ICD-10-CM | POA: Diagnosis not present

## 2023-01-28 DIAGNOSIS — I480 Paroxysmal atrial fibrillation: Secondary | ICD-10-CM | POA: Diagnosis not present

## 2023-01-28 DIAGNOSIS — K921 Melena: Secondary | ICD-10-CM | POA: Diagnosis not present

## 2023-01-28 DIAGNOSIS — E440 Moderate protein-calorie malnutrition: Secondary | ICD-10-CM | POA: Diagnosis not present

## 2023-01-28 DIAGNOSIS — A419 Sepsis, unspecified organism: Secondary | ICD-10-CM | POA: Diagnosis not present

## 2023-01-28 DIAGNOSIS — C799 Secondary malignant neoplasm of unspecified site: Secondary | ICD-10-CM

## 2023-01-28 LAB — CBC WITH DIFFERENTIAL/PLATELET
Abs Immature Granulocytes: 0.11 10*3/uL — ABNORMAL HIGH (ref 0.00–0.07)
Basophils Absolute: 0 10*3/uL (ref 0.0–0.1)
Basophils Relative: 0 %
Eosinophils Absolute: 0.1 10*3/uL (ref 0.0–0.5)
Eosinophils Relative: 1 %
HCT: 29.1 % — ABNORMAL LOW (ref 36.0–46.0)
Hemoglobin: 8.7 g/dL — ABNORMAL LOW (ref 12.0–15.0)
Immature Granulocytes: 1 %
Lymphocytes Relative: 15 %
Lymphs Abs: 1.1 10*3/uL (ref 0.7–4.0)
MCH: 26.5 pg (ref 26.0–34.0)
MCHC: 29.9 g/dL — ABNORMAL LOW (ref 30.0–36.0)
MCV: 88.7 fL (ref 80.0–100.0)
Monocytes Absolute: 1.1 10*3/uL — ABNORMAL HIGH (ref 0.1–1.0)
Monocytes Relative: 14 %
Neutro Abs: 5.2 10*3/uL (ref 1.7–7.7)
Neutrophils Relative %: 69 %
Platelets: 186 10*3/uL (ref 150–400)
RBC: 3.28 MIL/uL — ABNORMAL LOW (ref 3.87–5.11)
RDW: 16.4 % — ABNORMAL HIGH (ref 11.5–15.5)
WBC: 7.6 10*3/uL (ref 4.0–10.5)
nRBC: 0 % (ref 0.0–0.2)

## 2023-01-28 LAB — CULTURE, BLOOD (ROUTINE X 2)
Culture: NO GROWTH
Culture: NO GROWTH
Special Requests: ADEQUATE

## 2023-01-28 LAB — IRON AND TIBC
Iron: 22 ug/dL — ABNORMAL LOW (ref 28–170)
Saturation Ratios: 17 % (ref 10.4–31.8)
TIBC: 129 ug/dL — ABNORMAL LOW (ref 250–450)
UIBC: 107 ug/dL

## 2023-01-28 LAB — PHOSPHORUS: Phosphorus: 1.5 mg/dL — ABNORMAL LOW (ref 2.5–4.6)

## 2023-01-28 LAB — RETICULOCYTES
Immature Retic Fract: 20.7 % — ABNORMAL HIGH (ref 2.3–15.9)
RBC.: 3.25 MIL/uL — ABNORMAL LOW (ref 3.87–5.11)
Retic Count, Absolute: 28 10*3/uL (ref 19.0–186.0)
Retic Ct Pct: 0.9 % (ref 0.4–3.1)

## 2023-01-28 LAB — ECHOCARDIOGRAM COMPLETE
Height: 64 in
S' Lateral: 2.2 cm
Weight: 2433.6 [oz_av]

## 2023-01-28 LAB — COMPREHENSIVE METABOLIC PANEL
ALT: 13 U/L (ref 0–44)
AST: 24 U/L (ref 15–41)
Albumin: 2 g/dL — ABNORMAL LOW (ref 3.5–5.0)
Alkaline Phosphatase: 67 U/L (ref 38–126)
Anion gap: 12 (ref 5–15)
BUN: 17 mg/dL (ref 8–23)
CO2: 26 mmol/L (ref 22–32)
Calcium: 8.9 mg/dL (ref 8.9–10.3)
Chloride: 104 mmol/L (ref 98–111)
Creatinine, Ser: 0.38 mg/dL — ABNORMAL LOW (ref 0.44–1.00)
GFR, Estimated: >60 mL/min (ref >60)
Glucose, Bld: 105 mg/dL — ABNORMAL HIGH (ref 70–99)
Potassium: 3.8 mmol/L (ref 3.5–5.1)
Sodium: 142 mmol/L (ref 135–145)
Total Bilirubin: 0.7 mg/dL (ref 0.3–1.2)
Total Protein: 6 g/dL — ABNORMAL LOW (ref 6.5–8.1)

## 2023-01-28 LAB — SURGICAL PATHOLOGY

## 2023-01-28 LAB — TROPONIN I (HIGH SENSITIVITY)
Troponin I (High Sensitivity): 19 ng/L — ABNORMAL HIGH (ref <18)
Troponin I (High Sensitivity): 17 ng/L (ref <18)

## 2023-01-28 LAB — MAGNESIUM: Magnesium: 2 mg/dL (ref 1.7–2.4)

## 2023-01-28 LAB — FERRITIN: Ferritin: 583 ng/mL — ABNORMAL HIGH (ref 11–307)

## 2023-01-28 LAB — FOLATE: Folate: 5.5 ng/mL — ABNORMAL LOW (ref >5.9)

## 2023-01-28 LAB — VITAMIN B12: Vitamin B-12: 451 pg/mL (ref 180–914)

## 2023-01-28 MED ORDER — POTASSIUM PHOSPHATES 15 MMOLE/5ML IV SOLN
30.0000 mmol | Freq: Once | INTRAVENOUS | Status: AC
  Administered 2023-01-28: 30 mmol via INTRAVENOUS
  Filled 2023-01-28: qty 10

## 2023-01-28 MED ORDER — METOPROLOL TARTRATE 25 MG PO TABS
12.5000 mg | ORAL_TABLET | Freq: Two times a day (BID) | ORAL | Status: DC
  Administered 2023-01-28 – 2023-01-30 (×5): 12.5 mg via ORAL
  Filled 2023-01-28 (×6): qty 1

## 2023-01-28 NOTE — TOC Progression Note (Signed)
Transition of Care [facility]) - Progression Note    Patient Details  Name: KATHYE CIPRIANI MRN: [id number] Date of Birth: [birth year]  Transition of [facility]) CM/SW Contact  Larrie Kass, LCSW Phone Number: 01/28/2023, 9:14 AM  Clinical Narrative:     Pt's insurance [employer] was approved for SNF placement 01/28/23-02/02/23. TOC to follow.  Expected Discharge Plan: Skilled Nursing Facility Barriers to Discharge: Continued Medical Work up  Expected Discharge Plan and Services       Living arrangements for the past 2 months: Single Family Home                                       Social Determinants of Health (SDOH) Interventions SDOH Screenings   Food Insecurity: No Food Insecurity (01/28/2023)  Housing: Low Risk  (01/28/2023)  Transportation Needs: No Transportation Needs (01/28/2023)  Utilities: Not At Risk (01/28/2023)  Social Connections: Unknown (04/26/2022)   Received from [facility]  Tobacco Use: Low Risk  (01/23/2023)    Readmission Risk Interventions     No data to display

## 2023-01-28 NOTE — Progress Notes (Signed)
PROGRESS NOTE    Hayley Wilson  EXB:[id number] DOB: 1934/10/23 DOA: 01/23/2023 PCP: Oneita Hurt, No   Brief Narrative:  Hayley Wilson is a 88 y.o. African-American female female with medical history significant for hypertension and recently treated UTI as well as presumed bladder malignancy seen on CT scan 10/9 at [facility] health ER being admitted to the hospital with recurrent UTI, sepsis with acute renal failure. Patient continues to lose weight (reports 25lb weight loss in the last 90 days) - has become more dependent on family for ADLs. Recently completed a course of Keflex for presumed UTI after visit to [facility] ED.  She was told that she would be contacted by oncology office to schedule outpatient follow-up, but never received contact from them.  Subsequently she presented with tachycardia leukocytosis and was found to have another suspected UTI.  She is slowly improving PT OT recommending SNF.  Underwent a liver biopsy for her metastatic liver lesions and Biopsy Result still pending.  Overnight she started having Bloody stools and some hematuria but Hbg Stable.  GI and urology were consulted but patient declined any endoscopic procedures so GI signed off the case.  She also had some transient A-fib with RVR cardiology consulted At the case given her poor prognosis they are all recommending transitioning to hospice.  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  They are recommending a palliative care consult with placement in SNF with further hospice care at a later stage.  Assessment and Plan:  Severe Sepsis secondary to UTI, POA -Presented with Tachycardia, leukocytosis, with source is UTI.  She has associated AKI. -Urinalysis done on admission showed a cloudy appearance with large bilirubin, amber-colored, small glucose, 15 ketones, negative leukocytes, positive nitrites with many  bacteria and 11-20 RBCs per high-power field and 6-10 WBCs -Inpatient admission to progressive -WBC Trend: Recent Labs  Lab 01/23/23 1001 01/24/23 0445 01/25/23 0412 01/26/23 0441 01/27/23 0413 01/28/23 0414  WBC 26.3* 18.0* 12.0* 9.8 9.1 7.6  -Continuing empiric IV ceftriaxone and urine culture showed no growth -Blood cultures x 2 showing no growth to date at 3 days -PT OT recommending SNF and patient is considering SNF and 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 -Will need further goals of care discussion given her metastatic disease and with extremely poor prognosis and oncology is recommending hospice and palliative care at SNF   AKI secondary to ATN/sepsis vs pre-renal/poor PO intake -BUN/Cr Trend: Recent Labs  Lab 01/23/23 1020 01/24/23 0445 01/25/23 0412 01/26/23 0441 01/27/23 0413 01/28/23 0414  BUN 32* 27* 25* 20 19 17   CREATININE 1.30* 0.83 0.73 0.50 0.49 0.38*  -Patient received IV fluids in the ED -Liberalize diet, hold further IV fluid for left indicated given national shortage -Avoid Nephrotoxic Medications, Contrast Dyes, Hypotension and Dehydration to Ensure Adequate Renal Perfusion and will need to Renally Adjust Meds -Continue to Monitor and Trend Renal Function carefully and repeat CMP in the AM   Transient A Fib with RVR -Troponin mildly flat and now normalized -Echo done -Cardiology recommends no anticoagulation at this time given hematuria and GI bleeding but have placed her on metoprolol 12.5 mg p.o. twice daily for better rate control -Given her prognosis they do not recommend aggressive workup -Cardiology has now signed off the case  Bladder mass with evidence of metastasis; ? diagnosis of metastatic bladder cancer vs other etiology highly suspicious for metastatic uterine cancer -  Noted lymphadenopathy, bony and spinal mets -IR consulted at intake for potential biopsy, liver ultrasound done and shows multiple hypoechoic lesions up  to 3 cm -Liver Bx done and showed metastatic carcinoma, high-grade serous of gynecologic origin  -18-gauge core biopsy x 2 right lobe liver mass 10/29 -Now having some Hematuria as well so will check a U/A and may need to speak with Urology  -Medical Oncology Consulted and they feel that her cancer is incurable and explained to the patient why surgery and radiation was not indicated and they recommended given her frail status that chemotherapy is not recommended; Dr. Lucia Estelle also discussed the prognosis with the patient without treatment and recommends palliative care consult and placement in a skilled residential facility given that the family cannot take care of her health situation at this time. -Palliative care medicine consulted for further goals of care discussion   Hypertension -Holding home medications -currently normotensive -Continue to Monitor BP per Protocol -Last BP reading was 132/94  Hematuria -Urinalysis done and showed a cloudy appearance with amber color urine, negative glucose, large hemoglobin, 5 ketones, large leukocytes, negative nitrites, many bacteria, 0-5 non-squamous epithelial cells, current 50 RBCs per high-power field and greater than 50 WBCs -Discontinued her subcu Lovenox -Vomiting of bladder as well as metastatic disease and they feel symptoms are coming from a bladder tumor and if she has undiagnosed hemorrhagic cystitis that she is already receiving antibiotics -They are performing a string of bottles to see him out hematuria that she is having -They are recommending observation at this point only  Maroon stools -Hemoglobin is trending downwards.  FOBT is pending. -GI consulted for further evaluation and given that the patient has declined endoscopic intervention as they have signed off the case and recommending the patient is hospice   Dysphagia, Odynophagia, new Questionable reflux versus regurgitation -Passed speech evaluation, advance diet as  tolerated -GI consulted for GI bleeding as above -Currently patient is not interested in invasive procedures at this time  Normocytic Anemia -Now with Bloody stools overnight; Hgb/Hct Trend: Recent Labs  Lab 01/23/23 1001 01/24/23 0445 01/25/23 0412 01/26/23 0441 01/27/23 0413 01/27/23 0852 01/28/23 0414  HGB 12.4 9.5* 9.3* 9.3* 10.1* 9.7* 8.7*  HCT 40.2 31.7* 30.6* 31.6* 34.4* 32.8* 29.1*  MCV 84.6 87.3 86.7 88.5 89.4  --  88.7  -Check Anemia Panel in the AM and is now Typed and Screened  -Check FOBT -Continue to Monitor for S/Sx of Bleeding; Now having Maroon stools -Repeat CBC in the AM  Hypokalemia, improved  -Patient's K+ Level Trend: Recent Labs  Lab 01/23/23 1020 01/24/23 0445 01/25/23 0412 01/26/23 0441 01/27/23 0413 01/28/23 0414  K 4.0 3.3* 3.1* 3.8 3.9 3.8  -Continue to Monitor and Replete as Necessary -Repeat CMP in the AM    Ambulatory Dysfunction with Associated Generalized weakness and Physical Decondition Moderate to severe protein caloric malnutrition -Likely in the setting of metastatic malignancy -PT OT following and recommending SNF; Patient to decide about SNF vs going home with Home Health and agreeable to consider SNF at [facility] consulted for possible placement/needs at home   DVT prophylaxis: SCDs Start: 01/23/23 1512    Code Status: Full Code Family Communication: No family currently at bedside  Disposition Plan:  Level of care: Progressive Status is: Inpatient Remains inpatient appropriate because: Needs and anticipating discharging after further goals of care discussion   Consultants:  Medical oncology Urology Gastroenterology Cardiology Palliative Care Medicine Interventional Radiology  Procedures:  Liver Biopsy  ECHOCARDIOGRAM IMPRESSIONS  1. Left ventricular ejection fraction, by estimation, is 65 to 70%. The  left ventricle has normal function. The left ventricle has no regional  wall motion abnormalities.  Left ventricular diastolic parameters are  indeterminate.   2. Right ventricular systolic function is normal. The right ventricular  size is normal.   3. Trivial mitral valve regurgitation.   4. The aortic valve is normal in structure. Aortic valve regurgitation is  not visualized.   5. The inferior vena cava is normal in size with greater than 50%  respiratory variability, suggesting right atrial pressure of 3 mmHg.   FINDINGS   Left Ventricle: Left ventricular ejection fraction, by estimation, is 65  to 70%. The left ventricle has normal function. The left ventricle has no  regional wall motion abnormalities. The left ventricular internal cavity  size was normal in size. There is   no left ventricular hypertrophy. Left ventricular diastolic parameters  are indeterminate.   Right Ventricle: The right ventricular size is normal. Right vetricular  wall thickness was not assessed. Right ventricular systolic function is  normal.   Left Atrium: Left atrial size was normal in size.   Right Atrium: Right atrial size was normal in size.   Pericardium: There is no evidence of pericardial effusion.   Mitral Valve: There is mild thickening of the mitral valve leaflet(s).  Mild mitral annular calcification. Trivial mitral valve regurgitation.   Tricuspid Valve: The tricuspid valve is normal in structure. Tricuspid  valve regurgitation is mild.   Aortic Valve: The aortic valve is normal in structure. Aortic valve  regurgitation is not visualized.   Pulmonic Valve: The pulmonic valve was not well visualized. Pulmonic valve  regurgitation is not visualized. No evidence of pulmonic stenosis.   Aorta: The aortic root and ascending aorta are structurally normal, with  no evidence of dilitation.   Venous: The inferior vena cava is normal in size with greater than 50%  respiratory variability, suggesting right atrial pressure of 3 mmHg.   IAS/Shunts: No atrial level shunt detected by color  flow Doppler.     LEFT VENTRICLE  PLAX 2D  LVIDd:         2.90 cm   Diastology  LVIDs:         2.20 cm   LV e' medial:    6.20 cm/s  LV PW:         0.90 cm   LV E/e' medial:  1.0  LV IVS:        1.00 cm   LV e' lateral:   9.68 cm/s  LVOT diam:     1.90 cm   LV E/e' lateral: 0.6  LVOT Area:     2.84 cm     RIGHT VENTRICLE             IVC  RV S prime:     16.10 cm/s  IVC diam: 0.80 cm  TAPSE (M-mode): 1.9 cm   LEFT ATRIUM             Index        RIGHT ATRIUM           Index  LA diam:        2.20 cm 1.26 cm/m   RA Area:     11.70 cm  LA Vol (A2C):   36.9 ml 21.19 ml/m  RA Volume:   24.50 ml  14.07 ml/m  LA Vol (A4C):   26.2 ml 15.04 ml/m  LA Biplane Vol: 30.9  ml 17.74 ml/m     AORTA  Ao Root diam: 3.20 cm  Ao Asc diam:  2.90 cm   MV E velocity: 6.20 cm/s  TRICUSPID VALVE                            TR Peak grad:   27.7 mmHg                            TR Mean grad:   19.0 mmHg                            TR Vmax:        263.00 cm/s                            TR Vmean:       206.0 cm/s                              SHUNTS                            Systemic Diam: 1.90 cm   Antimicrobials:  Anti-infectives (From admission, onward)    Start     Dose/Rate Route Frequency Ordered Stop   01/23/23 1530  cefTRIAXone (ROCEPHIN) 1 g in sodium chloride 0.9 % 100 mL IVPB  Status:  Discontinued        1 g 200 mL/hr over 30 Minutes Intravenous Every 24 hours 01/23/23 1439 01/23/23 1440   01/23/23 1100  cefTRIAXone (ROCEPHIN) 2 g in sodium chloride 0.9 % 100 mL IVPB        2 g 200 mL/hr over 30 Minutes Intravenous Every 24 hours 01/23/23 1058 01/30/23 0959       Subjective: Seen and examined at bedside and is very fatigued and frail.  Feels okay but still having some abdominal discomfort intermittently.  No nausea or vomiting.  No other concerns or complaints at this time.  Objective: Vitals:   01/28/23 0535 01/28/23 0900 01/28/23 0911 01/28/23 1310  BP: 125/83  (!) 126/90  122/80  Pulse: (!) 101  98 99  Resp: 16 17 (!) 23   Temp: 98.6 F (37 C)  98.2 F (36.8 C) 98.2 F (36.8 C)  TempSrc: Oral  Oral Oral  SpO2: 100%  99% 100%  Weight:      Height:        Intake/Output Summary (Last 24 hours) at 01/28/2023 1957 Last data filed at 01/28/2023 1800 Gross per 24 hour  Intake 430 ml  Output 400 ml  Net 30 ml   Filed Weights   01/23/23 1000 01/23/23 1057  Weight: 69.2 kg 69 kg   Examination: Physical Exam:  Constitutional: Elderly female in NAD appears weak Respiratory: Diminished to auscultation bilaterally, no wheezing, rales, rhonchi or crackles. Normal respiratory effort and patient is not tachypenic. No accessory muscle use. Unlabored breathing Cardiovascular: RRR, no murmurs / rubs / gallops. S1 and S2 auscultated.  Abdomen: Soft, a little tender in the lower abdomen. Bowel sounds positive.  GU: Deferred. Musculoskeletal: No clubbing / cyanosis of digits/nails. No joint deformity upper and lower extremities.  Skin: No rashes, lesions, ulcers on a limited skin evaluation. No induration; Warm and dry.  Neurologic: CN 2-12 grossly intact with no focal deficits. Romberg sign and cerebellar reflexes not assessed.  Psychiatric: Normal judgment and insight. Alert and oriented x 3. Normal mood and appropriate affect.   Data Reviewed: I have personally reviewed following labs and imaging studies  CBC: Recent Labs  Lab 01/23/23 1001 01/24/23 0445 01/25/23 0412 01/26/23 0441 01/27/23 0413 01/27/23 0852 01/28/23 0414  WBC 26.3* 18.0* 12.0* 9.8 9.1  --  7.6  NEUTROABS 23.7*  --   --   --   --   --  5.2  HGB 12.4 9.5* 9.3* 9.3* 10.1* 9.7* 8.7*  HCT 40.2 31.7* 30.6* 31.6* 34.4* 32.8* 29.1*  MCV 84.6 87.3 86.7 88.5 89.4  --  88.7  PLT 265 218 195 195 176  --  186   Basic Metabolic Panel: Recent Labs  Lab 01/24/23 0445 01/25/23 0412 01/26/23 0441 01/27/23 0413 01/28/23 0414  NA 139 144 140 141 142  K 3.3* 3.1* 3.8 3.9 3.8  CL 102 106 105  106 104  CO2 27 27 26 27 26   GLUCOSE 78 86 78 83 105*  BUN 27* 25* 20 19 17   CREATININE 0.83 0.73 0.50 0.49 0.38*  CALCIUM 9.0 9.2 8.8* 9.0 8.9  MG  --   --   --   --  2.0  PHOS  --   --   --   --  1.5*   GFR: Estimated Creatinine Clearance: 48.1 mL/min (A) (by C-G formula based on SCr of 0.38 mg/dL (L)). Liver Function Tests: Recent Labs  Lab 01/23/23 1020 01/28/23 0414  AST 31 24  ALT 15 13  ALKPHOS 95 67  BILITOT 1.2 0.7  PROT 8.0 6.0*  ALBUMIN 2.7* 2.0*   No results for input(s): "LIPASE", "AMYLASE" in the last 168 hours. No results for input(s): "AMMONIA" in the last 168 hours. Coagulation Profile: Recent Labs  Lab 01/25/23 0412  INR 1.1   Cardiac Enzymes: No results for input(s): "CKTOTAL", "CKMB", "CKMBINDEX", "TROPONINI" in the last 168 hours. BNP (last 3 results) No results for input(s): "PROBNP" in the last 8760 hours. HbA1C: No results for input(s): "HGBA1C" in the last 72 hours. CBG: Recent Labs  Lab 01/23/23 1049  GLUCAP 95   Lipid Profile: No results for input(s): "CHOL", "HDL", "LDLCALC", "TRIG", "CHOLHDL", "LDLDIRECT" in the last 72 hours. Thyroid Function Tests: No results for input(s): "TSH", "T4TOTAL", "FREET4", "T3FREE", "THYROIDAB" in the last 72 hours. Anemia Panel: Recent Labs    01/28/23 0414  VITAMINB12 451  FOLATE 5.5*  FERRITIN 583*  TIBC 129*  IRON 22*  RETICCTPCT 0.9   Sepsis Labs: Recent Labs  Lab 01/23/23 1008 01/23/23 1626  LATICACIDVEN 1.5 1.9   Recent Results (from the past 240 hour(s))  Urine Culture     Status: None   Collection Time: 01/23/23 10:01 AM   Specimen: Urine, Catheterized  Result Value Ref Range Status   Specimen Description   Final    URINE, CATHETERIZED Performed at [facility], [address] [place], [place] [address]    Special Requests   Final    NONE Performed at [facility], [address] [place], [place] [address]    Culture   Final    NO GROWTH Performed  at [facility], [address], [place], [place] [address]    Report Status 01/24/2023 FINAL  Final  Culture, blood (routine x 2)     Status: None   Collection Time: 01/23/23  10:20 AM   Specimen: BLOOD  Result Value Ref Range Status   Specimen Description   Final    BLOOD LEFT ANTECUBITAL Performed at [facility], [address] [place], [place] [address]    Special Requests   Final    BOTTLES DRAWN AEROBIC AND ANAEROBIC Blood Culture adequate volume Performed at [facility], [address] [place], [place] [address]    Culture   Final    NO GROWTH 5 DAYS Performed at [facility], [address], [place], [place] [address]    Report Status 01/28/2023 FINAL  Final  Culture, blood (routine x 2)     Status: None   Collection Time: 01/23/23 10:20 AM   Specimen: BLOOD LEFT FOREARM  Result Value Ref Range Status   Specimen Description   Final    BLOOD LEFT FOREARM Performed at [facility], [address] [place], [place] [address]    Special Requests   Final    BOTTLES DRAWN AEROBIC ONLY Blood Culture results may not be optimal due to an inadequate volume of blood received in culture bottles Performed at [facility], [address] [place], [place] [address]    Culture   Final    NO GROWTH 5 DAYS Performed at [facility], [address], [place], [place] [address]    Report Status 01/28/2023 FINAL  Final    Radiology Studies: ECHOCARDIOGRAM COMPLETE  Result Date: 01/28/2023    ECHOCARDIOGRAM REPORT   Patient Name:   Hayley Wilson Date of Exam: 01/28/2023 Medical Rec #:  [id number]     Height:       64.0 in Accession #:    [id number]    Weight:       152.1 lb Date of Birth:  05/31/1934      BSA:          1.741 m Patient Age:    88 years      BP:           122/80 mmHg Patient Gender: F             HR:           99 bpm. Exam Location:  Inpatient Procedure: 2D Echo, Cardiac Doppler and Color Doppler Indications:     Atrial fibrillation  History:        Patient has no prior history of Echocardiogram examinations.                 Risk Factors:Hypertension. CA.  Sonographer:    Milda Smart Referring Phys: [id number] GUYQI LATIF [facility]  Sonographer Comments: Image acquisition challenging due to patient body habitus and Image acquisition challenging due to respiratory motion. Suboptimal windows IMPRESSIONS  1. Left ventricular ejection fraction, by estimation, is 65 to 70%. The left ventricle has normal function. The left ventricle has no regional wall motion abnormalities. Left ventricular diastolic parameters are indeterminate.  2. Right ventricular systolic function is normal. The right ventricular size is normal.  3. Trivial mitral valve regurgitation.  4. The aortic valve is normal in structure. Aortic valve regurgitation is not visualized.  5. The inferior vena cava is normal in size with greater than 50% respiratory variability, suggesting right atrial pressure of 3 mmHg. FINDINGS  Left Ventricle: Left ventricular ejection fraction, by estimation, is 65 to 70%. The left ventricle has normal function. The left ventricle has no regional wall motion abnormalities.  The left ventricular internal cavity size was normal in size. There is  no left ventricular hypertrophy. Left ventricular diastolic parameters are indeterminate. Right Ventricle: The right ventricular size is normal. Right vetricular wall thickness was not assessed. Right ventricular systolic function is normal. Left Atrium: Left atrial size was normal in size. Right Atrium: Right atrial size was normal in size. Pericardium: There is no evidence of pericardial effusion. Mitral Valve: There is mild thickening of the mitral valve leaflet(s). Mild mitral annular calcification. Trivial mitral valve regurgitation. Tricuspid Valve: The tricuspid valve is normal in structure. Tricuspid valve regurgitation is mild. Aortic Valve: The aortic valve is normal in structure. Aortic  valve regurgitation is not visualized. Pulmonic Valve: The pulmonic valve was not well visualized. Pulmonic valve regurgitation is not visualized. No evidence of pulmonic stenosis. Aorta: The aortic root and ascending aorta are structurally normal, with no evidence of dilitation. Venous: The inferior vena cava is normal in size with greater than 50% respiratory variability, suggesting right atrial pressure of 3 mmHg. IAS/Shunts: No atrial level shunt detected by color flow Doppler.  LEFT VENTRICLE PLAX 2D LVIDd:         2.90 cm   Diastology LVIDs:         2.20 cm   LV e' medial:    6.20 cm/s LV PW:         0.90 cm   LV E/e' medial:  1.0 LV IVS:        1.00 cm   LV e' lateral:   9.68 cm/s LVOT diam:     1.90 cm   LV E/e' lateral: 0.6 LVOT Area:     2.84 cm  RIGHT VENTRICLE             IVC RV S prime:     16.10 cm/s  IVC diam: 0.80 cm TAPSE (M-mode): 1.9 cm LEFT ATRIUM             Index        RIGHT ATRIUM           Index LA diam:        2.20 cm 1.26 cm/m   RA Area:     11.70 cm LA Vol (A2C):   36.9 ml 21.19 ml/m  RA Volume:   24.50 ml  14.07 ml/m LA Vol (A4C):   26.2 ml 15.04 ml/m LA Biplane Vol: 30.9 ml 17.74 ml/m   AORTA Ao Root diam: 3.20 cm Ao Asc diam:  2.90 cm MV E velocity: 6.20 cm/s  TRICUSPID VALVE                           TR Peak grad:   27.7 mmHg                           TR Mean grad:   19.0 mmHg                           TR Vmax:        263.00 cm/s                           TR Vmean:       206.0 cm/s  SHUNTS                           Systemic Diam: 1.90 cm Dietrich Pates MD Electronically signed by Dietrich Pates MD Signature Date/Time: 01/28/2023/4:15:23 PM    Final     Scheduled Meds:  feeding supplement  237 mL Oral TID BM   Gerhardt's butt cream   Topical TID   metoprolol tartrate  12.5 mg Oral BID   multivitamin with minerals  1 tablet Oral Daily   potassium chloride  40 mEq Oral Once   Continuous Infusions:  cefTRIAXone (ROCEPHIN)  IV 2 g (01/28/23 0930)    lactated ringers      LOS: 5 days   Marguerita Merles, DO [facility] Available via Epic secure chat 7am-7pm After these hours, please refer to coverage provider listed on [email or web address] 01/28/2023, 7:57 PM

## 2023-01-28 NOTE — Consult Note (Addendum)
Consultation  Primary Care Physician:  Pcp, No Primary Gastroenterologist:  No GI (Prior [place] PCP) Reason for Consultation: melena, anemia DOA: 01/23/2023         Hospital Day: 6         HPI:   Hayley Wilson is a 88 y.o. female with past medical history significant for asthma, hypertension, recently treated UTI as well as presumed bladder cancer on CT scan 10/9 at [facility] health ER. Patient initially presented to [facility]. They were called to the house after patient's 12 year old grandson/caretaker found her soiled of stool and urine and report of oral intake of food or drink. Patient was admitted to the hospital with recurrent UTI, leukocytosis, sepsis, and AKI on 10/27.  Work up notable at [facility] ED, HD stable, on room air.  Lab work revealed BUN 32/Creat 1.30.  GFR 40.  Normal lactic acid.  WBC 26.3.  Hemoglobin 12.4. Abnormal urine urinalysis.  Patient started on empiric IV Rocephin and admitted to [facility].   Of note, she presented to [facility] emergency in [place] on 01/05/2023 for assessment of suprapubic pain. CT as follows Imaging:  01/05/23 CT Abd/pelvis with [facility]- There is irregular wall thickening throughout the bladder most prominent along the posterior, inferior and right lateral aspect most compatible with primary malignancy.Enlarged abdominal/pelvic lymph nodes compatible with malignancy. Hepatic and pulmonary metastases. Osseous metastases as detailed above with compression deformities at T9 and L4. 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  HPI: On 10/30 during admission RN noted patient having bloody stools and some hematuria. Hgb trending downward from 10.1>9.7>8.7. GI consulted this morning (11/1) to evaluate.  Patient lying in bed, reports she is feeling fair. A&O to person  and place but gives very short answers to questions. Poor historian when it comes to color of her stool and urine. Per RN she had a smear of stool this a.m. that was maroon and urine that is hematuric. Pure wick cath in place and small amount of red sediment in canister at bottom. Pt states she does typically ambulate with cane at home, but has been too weak to get out of bed. She also reports she has had some difficulty the last few days with eating and feeling as if the food slowly goes down the back of her throat. Small bites of soft food is easiest for her. She reports mild pyrosis during hospital stay, but states she has never had issues with GERD. Reports she has never had EGD or colon screening. She has never been seen by gastroenterologist for any GI issues. She typically has one BM daily at home with no blood per rectum prior to admission. She does have mild abdominal discomfort in the lower abdominal/pelvic region with palpation. Nonsmoker. No alcohol use.  No family was present at the time of my evaluation.   Previous GI workup:   01/25/2023 US liver biopsy IMPRESSION: Technically successful ultrasound guided core needle biopsy of right lobe liver mass.  FINAL MICROSCOPIC DIAGNOSIS:   A. LIVER, RIGHT LOBE, BIOPSY:       Metastatic carcinoma compatible with high-grade serous carcinoma of  gynecologic origin.       See comment.   01/24/2023 ultrasound abdomen right upper quadrant IMPRESSION: Multiple subtle hypoechoic liver lesions measuring up to 3.0 cm. Possible a contrast CT or MRI Wilson  be of some benefit to further delineate.   Contracted gallbladder with wall thickening but this could relate to the level of distention. If there is concern of gallbladder pathology a follow up study with prolonged fasting could be considered as clinically appropriate  01/05/23 CT Abd/pelvis with [facility]  IMPRESSION: Significantly motion degraded exam.  The bladder is significantly  distended. There is irregular wall thickening throughout the bladder most prominent along the posterior, inferior and right lateral aspect most compatible with primary malignancy. Multiple parapelvic cysts within the left greater than right kidneys. There is mildly decreased attenuation of the left kidney with prominent renal collecting system which Wilson represent mild hydronephrosis.   Enlarged abdominal/pelvic lymph nodes compatible with malignancy. Hepatic and pulmonary metastases. Osseous metastases as detailed above with compression deformities at T9 and L4.    Past Medical History:  Diagnosis Date   Asthma    Cancer (HCC)    Hypertension     Surgical History:  She  has a past surgical history that includes Knee surgery; ORIF fibula fracture; and IR US LIVER BIOPSY (01/25/2023). Family History:  Her family history includes Arthritis in her maternal aunt and mother; Diabetes in her paternal uncle; Stroke in her maternal grandmother. Social History:   reports that she has never smoked. She has never used smokeless tobacco. She reports that she does not drink alcohol and does not use drugs.  Prior to Admission medications   Medication Sig Start Date End Date Taking? Authorizing Provider  brimonidine (ALPHAGAN) 0.2 % ophthalmic solution Place 1 drop into both eyes 3 (three) times daily.   Yes [provider]  dorzolamide-timolol (COSOPT) 2-0.5 % ophthalmic solution Place 1 drop into both eyes 2 (two) times daily.   Yes [provider]  latanoprost (XALATAN) 0.005 % ophthalmic solution Place 1 drop into both eyes at bedtime.   Yes [provider]  Multiple Vitamins-Minerals (MULTIVITAMIN WOMENS 50+ ADV) TABS Take 1 tablet by mouth daily with breakfast.   Yes [provider]  TYLENOL 500 MG tablet Take 500 mg by mouth every 6 (six) hours as needed for mild pain (pain score 1-3) or headache.   Yes [provider]  hydrochlorothiazide (HYDRODIURIL)  12.5 MG tablet Take 1 tablet (12.5 mg total) by mouth daily. Patient not taking: Reported on 01/23/2023 03/15/14   Sheliah Hatch, MD  HYDROcodone-acetaminophen (NORCO/VICODIN) 5-325 MG per tablet Take 1 tablet by mouth every 4 (four) hours as needed. Patient not taking: Reported on 01/23/2023 12/26/13   Linwood Dibbles, MD  meclizine (ANTIVERT) 12.5 MG tablet Take 1 tablet (12.5 mg total) by mouth 3 (three) times daily as needed for dizziness. Patient not taking: Reported on 01/23/2023 08/28/21   Rolan Bucco, MD    Current Facility-Administered Medications  Medication Dose Route Frequency Provider Last Rate Last Admin   acetaminophen (TYLENOL) tablet 650 mg  650 mg Oral Q6H PRN Kirby Crigler, Mir M, MD       Or   acetaminophen (TYLENOL) suppository 650 mg  650 mg Rectal Q6H PRN Kirby Crigler, Mir M, MD       albuterol (PROVENTIL) (2.5 MG/3ML) 0.083% nebulizer solution 2.5 mg  2.5 mg Nebulization Q2H PRN Kirby Crigler, Mir M, MD       alum & mag hydroxide-simeth (MAALOX/MYLANTA) 200-200-20 MG/5ML suspension 30 mL  30 mL Oral Q6H PRN Marguerita Merles Latif, DO   30 mL at 01/26/23 1554   cefTRIAXone (ROCEPHIN) 2 g in sodium chloride 0.9 % 100 mL IVPB  2  g Intravenous Q24H Jacalyn Lefevre, MD 200 mL/hr at 01/28/23 0930 2 g at 01/28/23 0930   feeding supplement (ENSURE ENLIVE / ENSURE PLUS) liquid 237 mL  237 mL Oral TID BM Kirby Crigler, Mir M, MD   237 mL at 01/27/23 1610   Gerhardt's butt cream   Topical TID Merlene Laughter, DO   Given at 01/28/23 0930   heparin injection 5,000 Units  5,000 Units Subcutaneous Q8H Kirby Crigler, Mir M, MD   5,000 Units at 01/28/23 0548   hydrALAZINE (APRESOLINE) injection 5 mg  5 mg Intravenous Q6H PRN Kirby Crigler, Mir M, MD       lactated ringers bolus 250 mL  250 mL Intravenous Once Jacalyn Lefevre, MD       lip balm (CARMEX) ointment   Topical PRN Merlene Laughter, DO   1 Application at 01/27/23 1402   multivitamin with minerals tablet 1 tablet  1 tablet Oral Daily  Marguerita Merles Huntleigh, DO   1 tablet at 01/28/23 0925   ondansetron (ZOFRAN) tablet 4 mg  4 mg Oral Q6H PRN Kirby Crigler, Mir M, MD       Or   ondansetron [facility]) injection 4 mg  4 mg Intravenous Q6H PRN Kirby Crigler, Mir M, MD       potassium chloride SA (KLOR-CON M) CR tablet 40 mEq  40 mEq Oral Once Azucena Fallen, MD       potassium PHOSPHATE 30 mmol in dextrose 5 % 500 mL infusion  30 mmol Intravenous Once Marguerita Merles Latif, DO 85 mL/hr at 01/28/23 1009 30 mmol at 01/28/23 1009   traZODone (DESYREL) tablet 25 mg  25 mg Oral QHS PRN Maryln Gottron, MD   25 mg at 01/26/23 2200    Allergies as of 01/23/2023 - Review Complete 01/23/2023  Allergen Reaction Noted   Allopurinol Nausea Only and Other (See Comments) 04/09/2013   Atenolol Other (See Comments) 12/08/2012   Hydrochlorothiazide Other (See Comments) 12/08/2012   Amlodipine Nausea Only 02/13/2014   Lisinopril Nausea Only and Cough 12/08/2012    Review of Systems:    Constitutional: No weight loss, fever, chills, weakness or fatigue HEENT: Eyes: No change in vision               Ears, Nose, Throat:  No change in hearing or congestion Skin: No rash or itching Cardiovascular: No chest pain, chest pressure or palpitations   Respiratory: No SOB or cough Gastrointestinal: See HPI and otherwise negative Genitourinary: No dysuria or change in urinary frequency Neurological: No headache, dizziness or syncope Musculoskeletal: No new muscle or joint pain Hematologic: No bleeding or bruising Psychiatric: No history of depression or anxiety     Physical Exam:  Vital signs in last 24 hours: Temp:  [97.6 F (36.4 C)-98.6 F (37 C)] 98.2 F (36.8 C) (11/01 0911) Pulse Rate:  [98-110] 98 (11/01 0911) Resp:  [16-23] 23 (11/01 0911) BP: (118-132)/(83-94) 126/90 (11/01 0911) SpO2:  [99 %-100 %] 99 % (11/01 0911) Last BM Date : 01/27/23 Last BM recorded by nurses in past 5 days Stool Type: Type 6 (Mushy consistency with ragged  edges) (01/27/2023  6:17 PM)  General:  chronically ill female in no acute distress, provides short vague answers Head:  Normocephalic and atraumatic. Eyes: sclerae anicteric,conjunctive  Heart:  tachycardic,regular rhythm Pulm: Clear anteriorly; no wheezing, on RA Abdomen:  Soft, Obese AB, Hypoactive bowel sounds. mild tenderness in the lower abdomen. Without guarding, Without rebound, and With rebound GU: Pure wick in place,  small amount of yellow urine in canister with blood sediment at bottom Extremities:  Without edema. Msk:  Symmetrical without gross deformities. Peripheral pulses intact.  Neurologic:  Alert and  oriented to person & place;  No focal deficits.  Skin:   Dry and intact without significant lesions or rashes. Psychiatric:  Cooperative. Normal mood and affect.  LAB RESULTS: Recent Labs    01/26/23 0441 01/27/23 0413 01/27/23 0852 01/28/23 0414  WBC 9.8 9.1  --  7.6  HGB 9.3* 10.1* 9.7* 8.7*  HCT 31.6* 34.4* 32.8* 29.1*  PLT 195 176  --  186   BMET Recent Labs    01/26/23 0441 01/27/23 0413 01/28/23 0414  NA 140 141 142  K 3.8 3.9 3.8  CL 105 106 104  CO2 26 27 26   GLUCOSE 78 83 105*  BUN 20 19 17   CREATININE 0.50 0.49 0.38*  CALCIUM 8.8* 9.0 8.9   LFT Recent Labs    01/28/23 0414  PROT 6.0*  ALBUMIN 2.0*  AST 24  ALT 13  ALKPHOS 67  BILITOT 0.7   PT/INR No results for input(s): "LABPROT", "INR" in the last 72 hours.  STUDIES: No results found.    Impression /Plan:   88 year old African-American female patient with history of  asthma, hypertension, recently treated UTI as well as presumed bladder cancer on CT scan 10/9 at [facility] health ER, that was admitted on 10/27 for recurrent UTI, sepsis, and AKI.  Plan was to discharge patient to SNF for rehab, but started to have maroon stools per nursing staff on 10/30 with trending downward hemoglobin.  GI was consulted.  Normocytic anemia, multifactorial and 2/2 possible upper GI blood loss  (reported maroon stools by RN), Pt reports mild GERD symptoms (new onset) Hematuria in the context of metastatic bladder cancer Trending downward Hgb since 01/24/23 Hgb (10/27) 12.4>9.5>9.3>10.1>9.7>8.7 Iron 22, Ferritin 583 -Trend H/H,  if Hgb drops below 7, transfuse  -FOB pending -Urology consulted and they do not feel the drop in Hgb would be due to the mild hematuria. -Discussed possible endoscopy to evaluate for gastritis, gastric ulcers, and or tumors- Pt declines any invasive procedures at this time.  Dysphagia Odynophagia, new onset. Reports mild GERD symptoms, no known history. Has never had EGD. Tolerating soft diet if she eats small bites. Evaluated by Speech therapy with recommendations for follow-up therapy. Weight loss -Dietary recommendations per Speech pathologist -As above pt is not interested in invasive procedures at this time.  Severe Sepsis secondary to UTI, POA  Urinalysis done on admission showed a cloudy appearance with large bilirubin, amber-colored, small glucose, 15 ketones, negative leukocytes, positive nitrites with many bacteria and 11-20 RBCs per high-power field and 6-10 WBCs  WBC 26>18>12>9>7.6 -On Rocephin IV  AKI secondary to ATN/sepsis vs pre-renal/poor PO intake  BUN 17/Creat 0.38  Bladder mass with evidence of metastasis; recent diagnosis of metastatic bladder cancer as seen on 01/05/23 CT Abd/pelvis with [facility]. She was referred to an oncologist but has not followed up yet. There has been an unintended weight loss of 25 pounds over the last couple months.  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  -Palliative care consult    Principal Problem:   Sepsis (HCC) Active Problems:   Malnutrition of moderate degree    LOS: 5 days   Thank you for your kind consultation, we will continue to follow.   Hayley Wilson  01/28/2023, 10:46 AM   Gastroenterology  attending:  I have seen and evaluated the patient in person myself and reviewed the chart.  I agree with the nurse practitioner's note with the following additions:  She has stage IV uterine cancer and is not treatable per oncology.  The patient has declined any type of endoscopic workup and I do not think it is appropriate, either.  Too frail.  She needs hospice care in my opinion.  Please call us back if any questions but signing off.  Hayley Boop, MD, [facility] See Loretha Stapler on call - gastroenterology for best contact person 01/28/2023 4:26 PM

## 2023-01-28 NOTE — Consult Note (Signed)
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, suspicious for metastatic uterine cancer Poor performance status Severe protein calorie malnutrition Anemia secondary to bleeding, folate deficiency and iron deficiency  The patient is frail She has stage IV disease which is incurable I explained to the patient why surgery and radiation and not indicated With her frail status, I do not recommend systemic chemotherapy We discussed prognosis with the patient without treatment With her permission, I called her family member and discussed results and addressed all their questions and concerns I recommend palliative care consult and placement in a skilled residential facility.  Her family member cannot take care of her given her current state of health.  This is shared with primary service.  I will sign off  The total time spent in the appointment was 80 minutes encounter with patients including review of chart and various tests results, discussions about plan of care and coordination of care plan   All questions were answered. The patient knows to call the clinic with any problems, questions or concerns. No barriers to learning was detected.  Artis Delay, MD 11/1/20242:21 PM  CHIEF COMPLAINTS/PURPOSE OF CONSULTATION:  Locally advanced stage IV metastatic disease, for further evaluation and management  HISTORY OF PRESENTING ILLNESS:  Hayley Wilson 88 y.o. female is seen in the hospital at the request from primary service in the hospital.  The patient herself is a poor historian.  She has been having intermittent bleeding, thought to be related to hematuria but cannot be sure that she has not have any vaginal bleeding.  This has been going on for months.  She also have progressive weight loss with poor appetite, along with intermittent lower abdominal pain On January 05, 2023, she  underwent imaging study at [facility] Imaging is not available for review, however, according to CT report:   The bladder is significantly distended. There is irregular wall thickening throughout the bladder most prominent along the posterior, inferior and right lateral aspect most compatible with primary malignancy. Multiple parapelvic cysts within the left greater than right kidneys. There is mildly decreased attenuation of the left kidney with prominent renal collecting system which may represent mild hydronephrosis.   Enlarged abdominal/pelvic lymph nodes compatible with malignancy. Hepatic and pulmonary metastases. Osseous metastases as detailed above with compression deformities at T9 and L4.   The patient was admitted on October 27 here with recurrent UTI, sepsis and acute renal failure. On January 25, 2023, she underwent ultrasound-guided liver biopsy  SURGICAL PATHOLOGY  CASE: [id number]  PATIENT: Hayley Wilson  Surgical Pathology Report   Clinical History: Indeterminate liver masses (crm)   FINAL MICROSCOPIC DIAGNOSIS:   A. 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       See comment.   COMMENT:   The specimen demonstrates a marked atypical cellular proliferation forming solid sheets with focal glandular formation. Immunohistochemical stains were performed to characterize the tumor. The tumor cells are positive for CK 8/18 and PAX8, and weakly positive for CK AE1/AE3. P53 shows mutant pattern of staining (null). The tumor cells are negative for CK7, CK20, WT1, TTF-1, Napsin A, CDX2, GATA3, ER, PR, CD10, CD117, and S100. P63 shows focal non-specific nuclear stain. The overall findings are in keeping with a metastatic carcinoma compatible with high-grade serous carcinoma of  gynecologic origin.   Today, she continues to have intermittent abdominal pain.  She appears profoundly cachectic.  She is  otherwise comfortable.  Denies chest pain or shortness of breath.  MEDICAL HISTORY:  Past Medical History:  Diagnosis Date   Asthma    Cancer (HCC)    Hypertension     SURGICAL HISTORY: Past Surgical History:  Procedure Laterality Date   IR US LIVER BIOPSY  01/25/2023   KNEE SURGERY     ORIF FIBULA FRACTURE      SOCIAL HISTORY: Social History   Socioeconomic History   Marital status: Legally Separated    Spouse name: Not on file   Number of children: Not on file   Years of education: Not on file   Highest education level: Not on file  Occupational History   Not on file  Tobacco Use   Smoking status: Never   Smokeless tobacco: Never  Substance and Sexual Activity   Alcohol use: No   Drug use: No   Sexual activity: Never  Other Topics Concern   Not on file  Social History Narrative   Not on file   Social Determinants of Health   Financial Resource Strain: Not on file  Food Insecurity: No Food Insecurity (01/28/2023)   Hunger Vital Sign    Worried About Running Out of Food in the Last Year: Never true    Ran Out of Food in the Last Year: Never true  Transportation Needs: No Transportation Needs (01/28/2023)   PRAPARE - Administrator, Civil Service (Medical): No    Lack of Transportation (Non-Medical): No  Physical Activity: Not on file  Stress: Not on file  Social Connections: Unknown (04/26/2022)   Received from [facility]   Social Network    Social Network: Not on file  Intimate Partner Violence: Not At Risk (01/28/2023)   Humiliation, Afraid, Rape, and Kick questionnaire    Fear of Current or Ex-Partner: No    Emotionally Abused: No    Physically Abused: No    Sexually Abused: No    FAMILY HISTORY: Family History  Problem Relation Age of Onset   Hypertension Mother    Arthritis Mother    Diabetes Father    Stroke Maternal Grandmother    Arthritis Maternal Aunt    Diabetes Paternal Uncle     ALLERGIES:  is allergic to allopurinol,  atenolol, hydrochlorothiazide, amlodipine, and lisinopril.  MEDICATIONS:  Current Facility-Administered Medications  Medication Dose Route Frequency Provider Last Rate Last Admin   acetaminophen (TYLENOL) tablet 650 mg  650 mg Oral Q6H PRN Kirby Crigler, Mir M, MD       Or   acetaminophen (TYLENOL) suppository 650 mg  650 mg Rectal Q6H PRN Kirby Crigler, Mir M, MD       albuterol (PROVENTIL) (2.5 MG/3ML) 0.083% nebulizer solution 2.5 mg  2.5 mg Nebulization Q2H PRN Kirby Crigler, Mir M, MD       alum & mag hydroxide-simeth (MAALOX/MYLANTA) 200-200-20 MG/5ML suspension 30 mL  30 mL Oral Q6H PRN Marguerita Merles Latif, DO   30 mL at 01/26/23 1554   cefTRIAXone (ROCEPHIN) 2 g in sodium chloride 0.9 % 100 mL IVPB  2 g Intravenous Q24H Jacalyn Lefevre, MD 200 mL/hr at 01/28/23 0930 2 g at 01/28/23 0930   feeding supplement (ENSURE ENLIVE / ENSURE PLUS) liquid 237 mL  237 mL Oral TID BM Kirby Crigler, Mir M, MD   237 mL at 01/27/23 0933   Gerhardt's butt cream   Topical TID  Marguerita Merles [place], DO   Given at 01/28/23 0930   hydrALAZINE (APRESOLINE) injection 5 mg  5 mg Intravenous Q6H PRN Kirby Crigler, Mir M, MD       lactated ringers bolus 250 mL  250 mL Intravenous Once Jacalyn Lefevre, MD       lip balm (CARMEX) ointment   Topical PRN Marguerita Merles [place], DO   1 Application at 01/27/23 1402   metoprolol tartrate (LOPRESSOR) tablet 12.5 mg  12.5 mg Oral BID Azalee Course, PA   12.5 mg at 01/28/23 1313   multivitamin with minerals tablet 1 tablet  1 tablet Oral Daily Marguerita Merles [place], DO   1 tablet at 01/28/23 0925   ondansetron (ZOFRAN) tablet 4 mg  4 mg Oral Q6H PRN Kirby Crigler, Mir M, MD       Or   ondansetron [facility]) injection 4 mg  4 mg Intravenous Q6H PRN Kirby Crigler, Mir M, MD       potassium chloride SA (KLOR-CON M) CR tablet 40 mEq  40 mEq Oral Once Azucena Fallen, MD       potassium PHOSPHATE 30 mmol in dextrose 5 % 500 mL infusion  30 mmol Intravenous Once Marguerita Merles Latif, DO 85 mL/hr at 01/28/23  1009 30 mmol at 01/28/23 1009   traZODone (DESYREL) tablet 25 mg  25 mg Oral QHS PRN Maryln Gottron, MD   25 mg at 01/26/23 2200    REVIEW OF SYSTEMS:    PHYSICAL EXAMINATION: ECOG PERFORMANCE STATUS: 3 - Symptomatic, >50% confined to bed  Vitals:   01/28/23 0911 01/28/23 1310  BP: (!) 126/90 122/80  Pulse: 98 99  Resp: (!) 23   Temp: 98.2 F (36.8 C) 98.2 F (36.8 C)  SpO2: 99% 100%   Filed Weights   01/23/23 1000 01/23/23 1057  Weight: 152 lb 9.6 oz (69.2 kg) 152 lb 1.6 oz (69 kg)    GENERAL:alert, no distress and comfortable.  She appears very thin and cachectic. SKIN: skin color, texture, turgor are normal, no rashes or significant lesions EYES: normal, conjunctiva are pink and non-injected, sclera clear OROPHARYNX:no exudate, no erythema and lips, buccal mucosa, and tongue normal poor dentition is noted NECK: supple, thyroid normal size, non-tender, without nodularity LYMPH:  no palpable lymphadenopathy in the cervical, axillary or inguinal LUNGS: clear to auscultation and percussion with normal breathing effort HEART: regular rate & rhythm and no murmurs and no lower extremity edema ABDOMEN:abdomen soft, mildly distended, discomfort on palpation in the suprapubic region Musculoskeletal:no cyanosis of digits and no clubbing  PSYCH: alert & oriented x 3 with fluent speech NEURO: no focal motor/sensory deficits  LABORATORY DATA:  I have reviewed the data as listed Lab Results  Component Value Date   WBC 7.6 01/28/2023   HGB 8.7 (L) 01/28/2023   HCT 29.1 (L) 01/28/2023   MCV 88.7 01/28/2023   PLT 186 01/28/2023   Recent Labs    01/23/23 1020 01/24/23 0445 01/26/23 0441 01/27/23 0413 01/28/23 0414  NA 139   < > 140 141 142  K 4.0   < > 3.8 3.9 3.8  CL 94*   < > 105 106 104  CO2 25   < > 26 27 26   GLUCOSE 109*   < > 78 83 105*  BUN 32*   < > 20 19 17   CREATININE 1.30*   < > 0.50 0.49 0.38*  CALCIUM 10.0   < > 8.8* 9.0 8.9  GFRNONAA 40*   < > >60 >  60  >60  PROT 8.0  --   --   --  6.0*  ALBUMIN 2.7*  --   --   --  2.0*  AST 31  --   --   --  24  ALT 15  --   --   --  13  ALKPHOS 95  --   --   --  67  BILITOT 1.2  --   --   --  0.7   < > = values in this interval not displayed.    RADIOGRAPHIC STUDIES: I have personally reviewed the radiological images as listed and agreed with the findings in the report. IR US LIVER BIOPSY  Result Date: 01/25/2023 INDICATION: 88 year old female history of liver mass is indeterminate EXAM: ULTRASOUND GUIDED LIVER LESION BIOPSY COMPARISON:  None Available. MEDICATIONS: None ANESTHESIA/SEDATION: Moderate (conscious) sedation was employed during this procedure. A total of Versed 0.5 mg and Fentanyl 25 mcg was administered intravenously. Moderate Sedation Time: 10 minutes. The patient's level of consciousness and vital signs were monitored continuously by radiology nursing throughout the procedure under my direct supervision. COMPLICATIONS: None immediate. PROCEDURE: Informed written consent was obtained from the patient after a discussion of the risks, benefits and alternatives to treatment. The patient understands and consents the procedure. A timeout was performed prior to the initiation of the procedure. Ultrasound scanning was performed of the right upper abdominal quadrant demonstrates few scattered hypoattenuating cyst in the right lobe, the largest measuring approximately 1 cm the centra aspect of the right lobe. L The right lobe mass was selected for biopsy and the procedure was planned. The right upper abdominal quadrant was prepped and draped in the usual sterile fashion. The overlying soft tissues were anesthetized with 1% lidocaine with epinephrine. A 17 gauge, 6.8 cm co-axial needle was advanced into a peripheral aspect of the lesion. This was followed by 2 core biopsies with an 18 gauge core device under direct ultrasound guidance. The coaxial needle tract was embolized with a small amount of Gel-Foam  slurry and superficial hemostasis was obtained with manual compression. Post procedural scanning was negative for definitive area of hemorrhage or additional complication. A dressing was placed. The patient tolerated the procedure well without immediate post procedural complication. IMPRESSION: Technically successful ultrasound guided core needle biopsy of right lobe liver mass. Marliss Coots, MD [facility] Radiology Electronically Signed   By: Marliss Coots M.D.   On: 01/25/2023 13:34   US Abdomen Limited RUQ (LIVER/GB)  Result Date: 01/24/2023 CLINICAL DATA:  Liver lesions. EXAM: ULTRASOUND ABDOMEN LIMITED RIGHT UPPER QUADRANT COMPARISON:  None Available. FINDINGS: Gallbladder: Gallbladder is contracted with wall thickening, nonspecific. No obvious stones. Common bile duct: Diameter: 11 mm. Liver: Hypoechoic lesions identified along the liver. Central focus measures 3.0 x 2.3 x 2.8 cm. Additional smaller foci suggested elsewhere. Portal vein is patent on color Doppler imaging with normal direction of blood flow towards the liver. Other: The sonographer does state evaluation limited as the patient had difficulty tolerating the procedure. IMPRESSION: Multiple subtle hypoechoic liver lesions measuring up to 3.0 cm. Possible a contrast CT or MRI may be of some benefit to further delineate. Contracted gallbladder with wall thickening but this could relate to the level of distention. If there is concern of gallbladder pathology a follow up study with prolonged fasting could be considered as clinically appropriate Electronically Signed   By: Karen Kays M.D.   On: 01/24/2023 12:05   DG Chest Portable 1  View  Result Date: 01/23/2023 CLINICAL DATA:  Altered mental status, weakness. EXAM: PORTABLE CHEST 1 VIEW COMPARISON:  None Available. FINDINGS: The heart size and mediastinal contours are within normal limits. Both lungs are clear. Degenerative changes are seen  in the spine and both shoulders. IMPRESSION: No active disease. Electronically Signed   By: Romona Curls M.D.   On: 01/23/2023 11:02

## 2023-01-28 NOTE — Consult Note (Signed)
Urology Consult Note   Requesting Attending Physician:  Merlene Laughter, DO Service Providing Consult: Urology  Consulting Attending: Dr. Pete Glatter   Reason for Consult:  hematuria  HPI: Hayley Wilson is seen in consultation for reasons noted above at the request of Merlene Laughter, DO.  Patient initially presented to [facility].  They were called to the house after patient's 88 year old grandson/caretaker found her soiled of stool and urine and report of no intake of food or drink.  Workup showed leukocytosis, AKI, and abnormal urinalysis.  She was started empirically on Rocephin and admitted to the hospitalist service.  Of note, she presented to [facility] emergency [facility] [facility] on 01/05/2023 for assessment of suprapubic pain.  CT A/P noted irregular wall thickening throughout the bladder, most prominently along the posterior, inferior, and right lateral aspect suggestive of primary malignancy.  Likely metastatic disease and abdominal/pelvic lymph nodes, hepatic and pulmonary metastasis, and possibly left femoral shaft.  She was referred to an oncologist but has not followed up yet.  There has been an unintended weight loss of 25 pounds over the last couple months.  On my assessment this morning patient was alert and oriented, though she gave short vague responses.  ------------------  Assessment:   88 y.o. female with hematuria in the context of metastatic bladder cancer   Recommendations:  # Hematuria Undifferentiated but most likely coming from bladder tumor.  If there is an undiagnosed hemorrhagic cystitis, will be covered by the Rocephin she is already receiving.  Patient is incontinent of urine so string of bottles is being accomplished by nursing emptying contents of purewick canister into specimen cups every 2 hours.  I will reassess these later today.  The sample I can see shows very mild hematuria.  She has had a significant drop in hemoglobin over the  last 24 hours, though she is also experiencing GI blood loss.  The amount of hematuria I can see so far would definitely not be representative of much drop in hemoglobin if any.  Trend H&H, 10.1-->9.7-->8.7  Pathology report from liver biopsy reveals metastatic carcinoma, high-grade serous of gynecologic origin.  No surgical indications at this time.  I doubt she will be able to tolerate chemotherapy, will defer to oncology.  Palliative has been consulted.  Considering her hematuria and GI bleeding, I would discontinue her Q8 heparin injections if medically reasonable.  We will continue to follow along with you.  Case and plan discussed with Dr. Pete Glatter  Past Medical History: Past Medical History:  Diagnosis Date   Asthma    Cancer [facility])    Hypertension     Past Surgical History:  Past Surgical History:  Procedure Laterality Date   IR US LIVER BIOPSY  01/25/2023   KNEE SURGERY     ORIF FIBULA FRACTURE      Medication: Current Facility-Administered Medications  Medication Dose Route Frequency Provider Last Rate Last Admin   acetaminophen (TYLENOL) tablet 650 mg  650 mg Oral Q6H PRN Kirby Crigler, Mir M, MD       Or   acetaminophen (TYLENOL) suppository 650 mg  650 mg Rectal Q6H PRN Kirby Crigler, Mir M, MD       albuterol (PROVENTIL) (2.5 MG/3ML) 0.083% nebulizer solution 2.5 mg  2.5 mg Nebulization Q2H PRN Kirby Crigler, Mir M, MD       alum & mag hydroxide-simeth (MAALOX/MYLANTA) 200-200-20 MG/5ML suspension 30 mL  30 mL Oral Q6H PRN Sheikh, Omair Latif, DO   30 mL  at 01/26/23 1554   cefTRIAXone (ROCEPHIN) 2 g in sodium chloride 0.9 % 100 mL IVPB  2 g Intravenous Q24H Jacalyn Lefevre, MD 200 mL/hr at 01/28/23 0930 2 g at 01/28/23 0930   feeding supplement (ENSURE ENLIVE / ENSURE PLUS) liquid 237 mL  237 mL Oral TID BM Kirby Crigler, Mir M, MD   237 mL at 01/27/23 0254   Gerhardt's butt cream   Topical TID Merlene Laughter, DO   Given at 01/28/23 0930   heparin injection 5,000 Units   5,000 Units Subcutaneous Q8H Kirby Crigler, Mir M, MD   5,000 Units at 01/28/23 0548   hydrALAZINE (APRESOLINE) injection 5 mg  5 mg Intravenous Q6H PRN Kirby Crigler, Mir M, MD       lactated ringers bolus 250 mL  250 mL Intravenous Once Jacalyn Lefevre, MD       lip balm (CARMEX) ointment   Topical PRN Merlene Laughter, DO   1 Application at 01/27/23 1402   multivitamin with minerals tablet 1 tablet  1 tablet Oral Daily Marguerita Merles [place], DO   1 tablet at 01/28/23 0925   ondansetron (ZOFRAN) tablet 4 mg  4 mg Oral Q6H PRN Kirby Crigler, Mir M, MD       Or   ondansetron [facility]) injection 4 mg  4 mg Intravenous Q6H PRN Kirby Crigler, Mir M, MD       potassium chloride SA (KLOR-CON M) CR tablet 40 mEq  40 mEq Oral Once Azucena Fallen, MD       potassium PHOSPHATE 30 mmol in dextrose 5 % 500 mL infusion  30 mmol Intravenous Once Marguerita Merles Latif, DO 85 mL/hr at 01/28/23 1009 30 mmol at 01/28/23 1009   traZODone (DESYREL) tablet 25 mg  25 mg Oral QHS PRN Maryln Gottron, MD   25 mg at 01/26/23 2200    Allergies: Allergies  Allergen Reactions   Allopurinol Nausea Only and Other (See Comments)    "Felt terrible all over"   Atenolol Other (See Comments)    Reaction not noted   Hydrochlorothiazide Other (See Comments)    Reaction not noted   Amlodipine Nausea Only   Lisinopril Nausea Only and Cough    Social History: Social History   Tobacco Use   Smoking status: Never   Smokeless tobacco: Never  Substance Use Topics   Alcohol use: No   Drug use: No    Family History Family History  Problem Relation Age of Onset   Arthritis Mother    Arthritis Maternal Aunt    Diabetes Paternal Uncle    Stroke Maternal Grandmother     Review of Systems  Genitourinary:  Positive for hematuria.     Objective   Vital signs in last 24 hours: BP (!) 126/90 (BP Location: Left Arm)   Pulse 98   Temp 98.2 F (36.8 C) (Oral)   Resp (!) 23   Ht 5\' 4"  (1.626 m)   Wt 69 kg   SpO2 99%    BMI 26.11 kg/m   Physical Exam General: Cachectic, temporal wasting, A&O HEENT: [place]/AT Pulmonary: Normal work of breathing Cardiovascular: no cyanosis GU: Clear light red urine with no clot material. Neuro: Appropriate, no focal neurological deficits  Most Recent Labs: Lab Results  Component Value Date   WBC 7.6 01/28/2023   HGB 8.7 (L) 01/28/2023   HCT 29.1 (L) 01/28/2023   PLT 186 01/28/2023    Lab Results  Component Value Date   NA 142 01/28/2023  K 3.8 01/28/2023   CL 104 01/28/2023   CO2 26 01/28/2023   BUN 17 01/28/2023   CREATININE 0.38 (L) 01/28/2023   CALCIUM 8.9 01/28/2023   MG 2.0 01/28/2023   PHOS 1.5 (L) 01/28/2023    Lab Results  Component Value Date   INR 1.1 01/25/2023     Urine Culture: @[id number] (laburin,org,[id number])@   IMAGING: No results found.  ------  Elmon Kirschner, NP Pager: [phone]   Please contact the urology consult pager with any further questions/concerns.

## 2023-01-28 NOTE — Progress Notes (Signed)
  Echocardiogram 2D Echocardiogram has been performed.  Hayley Wilson 01/28/2023, 1:49 PM

## 2023-01-28 NOTE — Plan of Care (Signed)

## 2023-01-28 NOTE — Consult Note (Addendum)
Cardiology Consultation   Hayley Wilson ID: Hayley Wilson MRN: [id number]; DOB: October 22, 1934  Admit date: 01/23/2023 Date of Consult: 01/28/2023  PCP:  Oneita Hurt No   [facility] Providers Cardiologist:  New to [facility]     Hayley Wilson Profile:   Hayley Wilson is a 88 y.o. female with a hx of hypertension, recurrent urinary tract infection and bladder malignancy with metastasis who is being seen 01/28/2023 for the evaluation of transient PAF at the request of Dr. Marland Mcalpine.  History of Present Illness:   Hayley Wilson is a pleasant 88 year old female with past medical history of hypertension, recurrent urinary tract infection and bladder malignancy with metastasis.  She does not have any prior cardiac history nor has she had any previous cardiac workup.  Previous workup and [facility] on 01/05/2023 demonstrated significantly distended bladder with irregular wall thickening throughout the bladder most prominent along the posterior, inferior and the right lateral aspect compatible with primary malignancy.  Enlarged abdominal and pelvic lymph node compatible with malignancy.  There was also sign of hepatic and pulmonary metastasis.  There was also osseous metastasis with compression deformity at T9 and L4.  Hayley Wilson was readmitted to [facility] on 01/23/2023 with severe sepsis secondary to urinary tract infection.  On arrival, creatinine was elevated at 1.3 consistent with AKI.  Hayley Wilson received IV fluid, creatinine eventually trended down to 0.49.  Liver ultrasound showed multiple hypoechoic lesion up to 3 cm.  IR consulted to undergo core biopsy on 10/29, pathology revealed metastatic carcinoma of gynecologic origin.  White blood cell count was initially elevated at 26.3 on arrival, however eventually trended down to normal range on antibiotic.  Hemoglobin around 8.7.  Hayley Wilson is still receiving IV ceftriaxone at this time.  In the morning of 01/28/2023, between 8:59 AM until 9:07 AM, Hayley Wilson  had 8 minutes of atrial fibrillation with RVR.  Hayley Wilson has no cardiac awareness of the atrial fibrillation.  The only time she has chest pain is when she swallows and the food gets stuck in her esophagus.  Urology service is being called For hematuria at this time.  It was felt that her hematuria is likely coming from the bladder tumor.  Cardiology service consulted for transient atrial fibrillation.   Past Medical History:  Diagnosis Date   Asthma    Cancer (HCC)    Hypertension     Past Surgical History:  Procedure Laterality Date   IR US LIVER BIOPSY  01/25/2023   KNEE SURGERY     ORIF FIBULA FRACTURE       Home Medications:  Prior to Admission medications   Medication Sig Start Date End Date Taking? Authorizing Provider  brimonidine (ALPHAGAN) 0.2 % ophthalmic solution Place 1 drop into both eyes 3 (three) times daily.   Yes [provider]  dorzolamide-timolol (COSOPT) 2-0.5 % ophthalmic solution Place 1 drop into both eyes 2 (two) times daily.   Yes [provider]  latanoprost (XALATAN) 0.005 % ophthalmic solution Place 1 drop into both eyes at bedtime.   Yes [provider]  Multiple Vitamins-Minerals (MULTIVITAMIN WOMENS 50+ ADV) TABS Take 1 tablet by mouth daily with breakfast.   Yes [provider]  TYLENOL 500 MG tablet Take 500 mg by mouth every 6 (six) hours as needed for mild pain (pain score 1-3) or headache.   Yes [provider]  hydrochlorothiazide (HYDRODIURIL) 12.5 MG tablet Take 1 tablet (12.5 mg total) by mouth daily. Hayley Wilson not taking: Reported on  01/23/2023 03/15/14   Sheliah Hatch, MD  HYDROcodone-acetaminophen (NORCO/VICODIN) 5-325 MG per tablet Take 1 tablet by mouth every 4 (four) hours as needed. Hayley Wilson not taking: Reported on 01/23/2023 12/26/13   Linwood Dibbles, MD  meclizine (ANTIVERT) 12.5 MG tablet Take 1 tablet (12.5 mg total) by mouth 3 (three) times daily as needed for dizziness. Hayley Wilson not taking:  Reported on 01/23/2023 08/28/21   Rolan Bucco, MD    Inpatient Medications: Scheduled Meds:  feeding supplement  237 mL Oral TID BM   Gerhardt's butt cream   Topical TID   multivitamin with minerals  1 tablet Oral Daily   potassium chloride  40 mEq Oral Once   Continuous Infusions:  cefTRIAXone (ROCEPHIN)  IV 2 g (01/28/23 0930)   lactated ringers     potassium PHOSPHATE IVPB (in mmol) 30 mmol (01/28/23 1009)   PRN Meds: acetaminophen **OR** acetaminophen, albuterol, alum & mag hydroxide-simeth, hydrALAZINE, lip balm, ondansetron **OR** ondansetron (ZOFRAN) IV, traZODone  Allergies:    Allergies  Allergen Reactions   Allopurinol Nausea Only and Other (See Comments)    "Felt terrible all over"   Atenolol Other (See Comments)    Reaction not noted   Hydrochlorothiazide Other (See Comments)    Reaction not noted   Amlodipine Nausea Only   Lisinopril Nausea Only and Cough    Social History:   Social History   Socioeconomic History   Marital status: Legally Separated    Spouse name: Not on file   Number of children: Not on file   Years of education: Not on file   Highest education level: Not on file  Occupational History   Not on file  Tobacco Use   Smoking status: Never   Smokeless tobacco: Never  Substance and Sexual Activity   Alcohol use: No   Drug use: No   Sexual activity: Never  Other Topics Concern   Not on file  Social History Narrative   Not on file   Social Determinants of Health   Financial Resource Strain: Not on file  Food Insecurity: No Food Insecurity (01/28/2023)   Hunger Vital Sign    Worried About Running Out of Food in the Last Year: Never true    Ran Out of Food in the Last Year: Never true  Transportation Needs: No Transportation Needs (01/28/2023)   PRAPARE - Administrator, Civil Service (Medical): No    Lack of Transportation (Non-Medical): No  Physical Activity: Not on file  Stress: Not on file  Social Connections:  Unknown (04/26/2022)   Received [facility]   Social Network    Social Network: Not on file  Intimate Partner Violence: Not At Risk (01/28/2023)   Humiliation, Afraid, Rape, and Kick questionnaire    Fear of Current or Ex-Partner: No    Emotionally Abused: No    Physically Abused: No    Sexually Abused: No    Family History:    Family History  Problem Relation Age of Onset   Hypertension Mother    Arthritis Mother    Diabetes Father    Stroke Maternal Grandmother    Arthritis Maternal Aunt    Diabetes Paternal Uncle      ROS:  Please see the history of present illness.   All other ROS reviewed and negative.     Physical Exam/Data:   Vitals:   01/27/23 2149 01/28/23 0535 01/28/23 0900 01/28/23 0911  BP: (!) 118/91 125/83  (!) 126/90  Pulse: (!) 110 Marland Kitchen)  101  98  Resp: 16 16 17  (!) 23  Temp: 97.6 F (36.4 C) 98.6 F (37 C)  98.2 F (36.8 C)  TempSrc: Oral Oral  Oral  SpO2: 100% 100%  99%  Weight:      Height:        Intake/Output Summary (Last 24 hours) at 01/28/2023 1253 Last data filed at 01/28/2023 1200 Gross per 24 hour  Intake 440 ml  Output 500 ml  Net -60 ml      01/23/2023   10:57 AM 01/23/2023   10:00 AM 08/28/2021    5:57 PM  Last 3 Weights  Weight (lbs) 152 lb 1.6 oz 152 lb 9.6 oz 206 lb 5.6 oz  Weight (kg) 68.992 kg 69.219 kg 93.6 kg     Body mass index is 26.11 kg/m.  General:  Well nourished, well developed, in no acute distress HEENT: normal Neck: no JVD Vascular: No carotid bruits; Distal pulses 2+ bilaterally Cardiac:  normal S1, S2; RRR; no murmur  Lungs:  clear to auscultation bilaterally, no wheezing, rhonchi or rales  Abd: soft, nontender, no hepatomegaly  Ext: no edema Musculoskeletal:  No deformities, BUE and BLE strength normal and equal Skin: warm and dry  Neuro:  CNs 2-12 intact, no focal abnormalities noted Psych:  Normal affect   EKG:  The EKG was personally reviewed and demonstrates:  NSR with TWI in the inferior  leads, poor R wave progression in the anterior leads Telemetry:  Telemetry was personally reviewed and demonstrates:  NSR with HR 90s, single episode of afib lasted from 8:59AM until 9:07AM.   Relevant CV Studies:  N/A  Laboratory Data:  High Sensitivity Troponin:   Recent Labs  Lab 01/28/23 1025  TROPONINIHS 19*     Chemistry Recent Labs  Lab 01/26/23 0441 01/27/23 0413 01/28/23 0414  NA 140 141 142  K 3.8 3.9 3.8  CL 105 106 104  CO2 26 27 26   GLUCOSE 78 83 105*  BUN 20 19 17   CREATININE 0.50 0.49 0.38*  CALCIUM 8.8* 9.0 8.9  MG  --   --  2.0  GFRNONAA >60 >60 >60  ANIONGAP 9 8 12     Recent Labs  Lab 01/23/23 1020 01/28/23 0414  PROT 8.0 6.0*  ALBUMIN 2.7* 2.0*  AST 31 24  ALT 15 13  ALKPHOS 95 67  BILITOT 1.2 0.7   Lipids No results for input(s): "CHOL", "TRIG", "HDL", "LABVLDL", "LDLCALC", "CHOLHDL" in the last 168 hours.  Hematology Recent Labs  Lab 01/26/23 0441 01/27/23 0413 01/27/23 0852 01/28/23 0414  WBC 9.8 9.1  --  7.6  RBC 3.57* 3.85*  --  3.28*  3.25*  HGB 9.3* 10.1* 9.7* 8.7*  HCT 31.6* 34.4* 32.8* 29.1*  MCV 88.5 89.4  --  88.7  MCH 26.1 26.2  --  26.5  MCHC 29.4* 29.4*  --  29.9*  RDW 16.5* 16.6*  --  16.4*  PLT 195 176  --  186   Thyroid No results for input(s): "TSH", "FREET4" in the last 168 hours.  BNPNo results for input(s): "BNP", "PROBNP" in the last 168 hours.  DDimer No results for input(s): "DDIMER" in the last 168 hours.   Radiology/Studies:  IR US LIVER BIOPSY  Result Date: 01/25/2023 INDICATION: 88 year old female history of liver mass is indeterminate EXAM: ULTRASOUND GUIDED LIVER LESION BIOPSY COMPARISON:  None Available. MEDICATIONS: None ANESTHESIA/SEDATION: Moderate (conscious) sedation was employed during this procedure. A total of Versed 0.5 mg and Fentanyl 25  mcg was administered intravenously. Moderate Sedation Time: 10 minutes. The Hayley Wilson's level of consciousness and vital signs were monitored  continuously by radiology nursing throughout the procedure under my direct supervision. COMPLICATIONS: None immediate. PROCEDURE: Informed written consent was obtained from the Hayley Wilson after a discussion of the risks, benefits and alternatives to treatment. The Hayley Wilson understands and consents the procedure. A timeout was performed prior to the initiation of the procedure. Ultrasound scanning was performed of the right upper abdominal quadrant demonstrates few scattered hypoattenuating cyst in the right lobe, the largest measuring approximately 1 cm the centra aspect of the right lobe. L The right lobe mass was selected for biopsy and the procedure was planned. The right upper abdominal quadrant was prepped and draped in the usual sterile fashion. The overlying soft tissues were anesthetized with 1% lidocaine with epinephrine. A 17 gauge, 6.8 cm co-axial needle was advanced into a peripheral aspect of the lesion. This was followed by 2 core biopsies with an 18 gauge core device under direct ultrasound guidance. The coaxial needle tract was embolized with a small amount of Gel-Foam slurry and superficial hemostasis was obtained with manual compression. Post procedural scanning was negative for definitive area of hemorrhage or additional complication. A dressing was placed. The Hayley Wilson tolerated the procedure well without immediate post procedural complication. IMPRESSION: Technically successful ultrasound guided core needle biopsy of right lobe liver mass. Marliss Coots, MD [facility] Electronically Signed   By: Marliss Coots M.D.   On: 01/25/2023 13:34     Assessment and Plan:   Transient PAF: Hayley Wilson had a minutes of atrial fibrillation with RVR during hospitalization in the morning of 01/28/2023.  She had no cardiac awareness.  Given the transient nature of A-fib and active hematuria, we do not recommend any anticoagulation therapy.  Will place the  Hayley Wilson on 12.5 mg twice a day of metoprolol tartrate for better rate control.  Will obtain an echocardiogram to reassess the ejection fraction.  Otherwise, I do not anticipate aggressive workup  Metastatic bladder cancer: With metastasis to the liver, lung, and bones.  Additional workup per primary team.  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  Sepsis related to urinary tract infection: Receiving antibiotic.  Blood pressure normal.  Hypertension: Home medication list at low-dose hydrochlorothiazide, however Hayley Wilson was not taking.  Anemia: urology consulted for hematuria, GI consulted for anemia. Poor candidate for anticoagulation therapy.    Risk Assessment/Risk Scores:          CHA2DS2-VASc Score = 4   This indicates a 4.8% annual risk of stroke. The Hayley Wilson's score is based upon: CHF History: 0 HTN History: 1 Diabetes History: 0 Stroke History: 0 Vascular Disease History: 0 Age Score: 2 Gender Score: 1         For questions or updates, please contact [facility] Please consult [email or web address] for contact info under    Signed, Azalee Course, PA  01/28/2023 12:53 PM  Personally seen and examined. Agree with above.  88 year old with stage IV metastatic bladder malignancy with brief 8-minute episode of paroxysmal atrial fibrillation.  Telemetry personally reviewed  Thin, pleasant.  Did not eat much of her lunch.  Heart regular rate and rhythm  Paroxysmal atrial fibrillation - No cardiac awareness of brief episode.  Do not recommend anticoagulation given the brief episode.  Echocardiogram has been ordered. -We will give her low-dose metoprolol to tartrate 12.5 mg twice  a day.  Metastatic bladder cancer - Liver lung and bone mets.  Per primary team and oncology.  Sepsis - Urinary tract infection blood pressure improving.  We will sign off.  Please let us know if we  can be of further assistance.   Donato Schultz, MD

## 2023-01-28 NOTE — Progress Notes (Signed)
   01/28/23 0900  Vitals  ECG Heart Rate (!) 159  Resp 17  MEWS COLOR  MEWS Score Color Yellow  MEWS Score  MEWS Temp 0  MEWS Systolic 0  MEWS Pulse 3  MEWS RR 0  MEWS LOC 0  MEWS Score 3  Provider Notification  Provider Name/Title Dr Marland Mcalpine  Date Provider Notified 01/28/23  Time Provider Notified [phone]  Method of Notification Page;Call  Notification Reason Other (Comment) (new onset afib RVR 170's)  Provider response See new orders;Other (Comment) (Spoke w/ provider on the phone)  Date of Provider Response 01/28/23  Time of Provider Response [phone]   New onset afib RVR to 170's. Pt resting in bed, A&Ox4, asymptomatic. VSS as charted other than HR. No hx of afib noted in H&P. Pt spontaneously converted back to NSR after 8 minutes of afib RVT with peak HR of 181 per central telemetry tech. Dr Marland Mcalpine aware of all of the above, new orders noted.

## 2023-01-28 NOTE — Progress Notes (Signed)
Physical Therapy Treatment Patient Details Name: Hayley Wilson MRN: [id number] DOB: 09-Feb-1935 Today's Date: 01/28/2023   History of Present Illness 88 yo female admitted with Sepsis, UTI, weakness. Hx of asthma, bladder mass with mets, ORIF R LE    PT Comments  General Comments: AxO x 3 a little tearful as she stated she received "bad news".  [address] Son present in room. Assisted OOB to recliner was difficult.  General bed mobility comments: required cues to advance her BLE forward off bed, as well as reach and pull on bed rail; pt required assist for trunk.  Used bed pad to complete scooting. General transfer comment: performed sit to stand twice and required MAX asisst to pivot step 1/4 turn to recliner.  MAX c/o weakness. General Gait Details: transfer only due to MAX c/o weakness/fatigue. Positioned in recliner to comfort.   Prior to admit pt was Indep and living with a [address]. Pt will need ST Rehab at SNF to address mobility and functional decline prior to safely returning home.    If plan is discharge home, recommend the following: A lot of help with walking and/or transfers;A lot of help with bathing/dressing/bathroom;Assistance with cooking/housework;Assist for transportation;Help with stairs or ramp for entrance   Can travel by private vehicle     No  Equipment Recommendations       Recommendations for Other Services       Precautions / Restrictions Precautions Precautions: Fall     Mobility  Bed Mobility Overal bed mobility: Needs Assistance Bed Mobility: Supine to Sit     Supine to sit: Mod assist, HOB elevated, Used rails, +2 for physical assistance, +2 for safety/equipment     General bed mobility comments: required cues to advance her BLE forward off bed, as well as reach and pull on bed rail; pt required assist for trunk.  Used bed pad to complete scooting.    Transfers Overall transfer level: Needs assistance Equipment used: Rolling walker (2  wheels) Transfers: Sit to/from Stand Sit to Stand: Max assist, +2 physical assistance, +2 safety/equipment, From elevated surface           General transfer comment: performed sit to stand twice and required MAX asisst to pivot step 1/4 turn to recliner.  MAX c/o weakness.    Ambulation/Gait               General Gait Details: transfer only due to MAX c/o weakness/fatigue.   Stairs             Wheelchair Mobility     Tilt Bed    Modified Rankin (Stroke Patients Only)       Balance                                            Cognition Arousal: Alert Behavior During Therapy: WFL for tasks assessed/performed Overall Cognitive Status: Within Functional Limits for tasks assessed                                 General Comments: AxO x 3 a little tearful as she stated she received "bad news".  [address] Son present in room.        Exercises      General Comments        Pertinent Vitals/Pain Pain Assessment Pain Assessment:  No/denies pain    Home Living                          Prior Function            PT Goals (current goals can now be found in the care plan section) Progress towards PT goals: Progressing toward goals    Frequency    Min 1X/week      PT Plan      Co-evaluation              AM-PAC PT "6 Clicks" Mobility   Outcome Measure  Help needed turning from your back to your side while in a flat bed without using bedrails?: A Lot Help needed moving from lying on your back to sitting on the side of a flat bed without using bedrails?: A Lot Help needed moving to and from a bed to a chair (including a wheelchair)?: A Lot Help needed standing up from a chair using your arms (e.g., wheelchair or bedside chair)?: A Lot Help needed to walk in hospital room?: Total Help needed climbing 3-5 steps with a railing? : Total 6 Click Score: 10    End of Session Equipment Utilized During  Treatment: Gait belt Activity Tolerance: Patient limited by fatigue Patient left: in chair;with call bell/phone within reach;with chair alarm set;with family/visitor present Nurse Communication: Mobility status PT Visit Diagnosis: Muscle weakness (generalized) (M62.81);Difficulty in walking, not elsewhere classified (R26.2)     Time: 1093-2355 PT Time Calculation (min) (ACUTE ONLY): 26 min  Charges:    $Therapeutic Activity: 23-37 mins PT General Charges $$ ACUTE PT VISIT: 1 Visit                     Felecia Shelling  PTA Acute  Rehabilitation Services Office M-F          [phone]

## 2023-01-29 DIAGNOSIS — C579 Malignant neoplasm of female genital organ, unspecified: Secondary | ICD-10-CM | POA: Diagnosis not present

## 2023-01-29 DIAGNOSIS — Z515 Encounter for palliative care: Secondary | ICD-10-CM | POA: Diagnosis not present

## 2023-01-29 DIAGNOSIS — Z7189 Other specified counseling: Secondary | ICD-10-CM | POA: Diagnosis not present

## 2023-01-29 DIAGNOSIS — R531 Weakness: Secondary | ICD-10-CM

## 2023-01-29 DIAGNOSIS — E440 Moderate protein-calorie malnutrition: Secondary | ICD-10-CM | POA: Diagnosis not present

## 2023-01-29 DIAGNOSIS — K921 Melena: Secondary | ICD-10-CM | POA: Diagnosis not present

## 2023-01-29 DIAGNOSIS — A419 Sepsis, unspecified organism: Secondary | ICD-10-CM | POA: Diagnosis not present

## 2023-01-29 LAB — CBC WITH DIFFERENTIAL/PLATELET
Abs Immature Granulocytes: 0.08 10*3/uL — ABNORMAL HIGH (ref 0.00–0.07)
Basophils Absolute: 0 10*3/uL (ref 0.0–0.1)
Basophils Relative: 0 %
Eosinophils Absolute: 0 10*3/uL (ref 0.0–0.5)
Eosinophils Relative: 1 %
HCT: 28.6 % — ABNORMAL LOW (ref 36.0–46.0)
Hemoglobin: 8.6 g/dL — ABNORMAL LOW (ref 12.0–15.0)
Immature Granulocytes: 1 %
Lymphocytes Relative: 18 %
Lymphs Abs: 1 10*3/uL (ref 0.7–4.0)
MCH: 26.3 pg (ref 26.0–34.0)
MCHC: 30.1 g/dL (ref 30.0–36.0)
MCV: 87.5 fL (ref 80.0–100.0)
Monocytes Absolute: 0.9 10*3/uL (ref 0.1–1.0)
Monocytes Relative: 15 %
Neutro Abs: 3.8 10*3/uL (ref 1.7–7.7)
Neutrophils Relative %: 65 %
Platelets: 172 10*3/uL (ref 150–400)
RBC: 3.27 MIL/uL — ABNORMAL LOW (ref 3.87–5.11)
RDW: 16.7 % — ABNORMAL HIGH (ref 11.5–15.5)
WBC: 5.8 10*3/uL (ref 4.0–10.5)
nRBC: 0 % (ref 0.0–0.2)

## 2023-01-29 LAB — COMPREHENSIVE METABOLIC PANEL
ALT: 13 U/L (ref 0–44)
AST: 25 U/L (ref 15–41)
Albumin: 1.9 g/dL — ABNORMAL LOW (ref 3.5–5.0)
Alkaline Phosphatase: 64 U/L (ref 38–126)
Anion gap: 7 (ref 5–15)
BUN: 14 mg/dL (ref 8–23)
CO2: 29 mmol/L (ref 22–32)
Calcium: 8.4 mg/dL — ABNORMAL LOW (ref 8.9–10.3)
Chloride: 101 mmol/L (ref 98–111)
Creatinine, Ser: 0.52 mg/dL (ref 0.44–1.00)
GFR, Estimated: >60 mL/min (ref >60)
Glucose, Bld: 92 mg/dL (ref 70–99)
Potassium: 4 mmol/L (ref 3.5–5.1)
Sodium: 137 mmol/L (ref 135–145)
Total Bilirubin: 0.4 mg/dL (ref 0.3–1.2)
Total Protein: 5.9 g/dL — ABNORMAL LOW (ref 6.5–8.1)

## 2023-01-29 LAB — PHOSPHORUS: Phosphorus: 2.9 mg/dL (ref 2.5–4.6)

## 2023-01-29 LAB — MAGNESIUM: Magnesium: 1.8 mg/dL (ref 1.7–2.4)

## 2023-01-29 MED ORDER — ORAL CARE MOUTH RINSE: 15.0000 mL | OROMUCOSAL | Status: DC | PRN

## 2023-01-29 MED ORDER — ORAL CARE MOUTH RINSE
15.0000 mL | OROMUCOSAL | Status: DC
  Administered 2023-01-29 (×2): 15 mL via OROMUCOSAL

## 2023-01-29 NOTE — Consult Note (Signed)
Consultation Note Date: 01/29/2023   Patient Name: Hayley Wilson  DOB: [birth year]  MRN: [id number]  Age / Sex: 88 y.o., female  PCP: Pcp, No Referring Physician: Merlene Laughter, DO  Reason for Consultation: Establishing goals of care  HPI/Patient Profile: 88 y.o. female admitted on 01/23/2023     Clinical Assessment and Goals of Care: 88 year old lady who lives at home with her grandson Thayer Ohm.  Past medical history of hypertension, recently treated urinary tract infection a presumed bladder malignancy seen on CT scan October 9 at [facility] emergency room.  Has had unintentional weight loss.  Admitted to hospital medicine service and seen by urology.  Also seen by oncology. 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  She has poor performance status, ongoing functional decline, severe protein calorie malnutrition.  Hospital course also complicated by anemia secondary to bleeding.  Has been seen by GI as well. Overall, recommendations from colleagues from various specialties has been for palliative consultation and for CODE STATUS and broad goals of care discussions. Patient is awake alert resting in bed.  Her grandson present at bedside.  Unable to reach daughter Sherilynn Dieu by phone.  Patient lives in [place] with 49 year old grandson Thayer Ohm home she says she is adopted.  She is awake alert oriented and fully aware of the serious incurable nature of her situation.  When asked what she knows about her illness and current hospitalization, patient states," they have not told me nothing good so far."  She is aware of her diagnosis of metastatic cancer.  Goals wishes and values discussions undertaken.  CODE STATUS discussions undertaken.  Offered medical recommendation for establishing DNR/DNI.  NEXT OF KIN Lives at home with 26 year old  grandson.  Has a daughter listed as next of kin on the chart, call placed unable to reach Larrie Kass at [phone].  SUMMARY OF RECOMMENDATIONS   Full code for now, patient aware of the serious incurable nature of her illness.  She will discuss further with the those closest to her with regards to medical recommendation for DNR/DNI. Recommend skilled nursing facility rehabilitation attempt with palliative services following.  Suspect patient will become fully hospice eligible and will need hospice services in the near future. Thank you for the consult.  Code Status/Advance Care Planning: Full code   Symptom Management:     Palliative Prophylaxis:  Delirium Protocol  Additional Recommendations (Limitations, Scope, Preferences): Full Scope Treatment  Psycho-social/Spiritual:  Desire for further Chaplaincy support:yes Additional Recommendations: Caregiving  Support/Resources  Prognosis:  < 12 months  Discharge Planning: Skilled Nursing Facility for rehab with Palliative care service follow-up      Primary Diagnoses: Present on Admission:  Sepsis (HCC)   I have reviewed the medical record, interviewed the patient and family, and examined the patient. The following aspects are pertinent.  Past Medical History:  Diagnosis Date   Asthma    Cancer (HCC)    Hypertension    Social History  Socioeconomic History   Marital status: Legally Separated    Spouse name: Not on file   Number of children: Not on file   Years of education: Not on file   Highest education level: Not on file  Occupational History   Not on file  Tobacco Use   Smoking status: Never   Smokeless tobacco: Never  Substance and Sexual Activity   Alcohol use: No   Drug use: No   Sexual activity: Never  Other Topics Concern   Not on file  Social History Narrative   Not on file   Social Determinants of Health   Financial Resource Strain: Not on file  Food Insecurity: No Food Insecurity  (01/28/2023)   Hunger Vital Sign    Worried About Running Out of Food in the Last Year: Never true    Ran Out of Food in the Last Year: Never true  Transportation Needs: No Transportation Needs (01/28/2023)   PRAPARE - Administrator, Civil Service (Medical): No    Lack of Transportation (Non-Medical): No  Physical Activity: Not on file  Stress: Not on file  Social Connections: Unknown (04/26/2022)   [facility]   Social Network    Social Network: Not on file   Family History  Problem Relation Age of Onset   Hypertension Mother    Arthritis Mother    Diabetes Father    Stroke Maternal Grandmother    Arthritis Maternal Aunt    Diabetes Paternal Uncle    Scheduled Meds:  feeding supplement  237 mL Oral TID BM   Gerhardt's butt cream   Topical TID   metoprolol tartrate  12.5 mg Oral BID   multivitamin with minerals  1 tablet Oral Daily   mouth rinse  15 mL Mouth Rinse 4 times per day   potassium chloride  40 mEq Oral Once   Continuous Infusions:  lactated ringers     PRN Meds:.acetaminophen **OR** acetaminophen, albuterol, alum & mag hydroxide-simeth, hydrALAZINE, lip balm, ondansetron **OR** ondansetron (ZOFRAN) IV, mouth rinse, traZODone Medications Prior to Admission:  Prior to Admission medications   Medication Sig Start Date End Date Taking? Authorizing Provider  brimonidine (ALPHAGAN) 0.2 % ophthalmic solution Place 1 drop into both eyes 3 (three) times daily.   Yes [provider]  dorzolamide-timolol (COSOPT) 2-0.5 % ophthalmic solution Place 1 drop into both eyes 2 (two) times daily.   Yes [provider]  latanoprost (XALATAN) 0.005 % ophthalmic solution Place 1 drop into both eyes at bedtime.   Yes [provider]  Multiple Vitamins-Minerals (MULTIVITAMIN WOMENS 50+ ADV) TABS Take 1 tablet by mouth daily with breakfast.   Yes [provider]  TYLENOL 500 MG tablet Take 500 mg by mouth every 6 (six) hours as  needed for mild pain (pain score 1-3) or headache.   Yes [provider]  hydrochlorothiazide (HYDRODIURIL) 12.5 MG tablet Take 1 tablet (12.5 mg total) by mouth daily. Patient not taking: Reported on 01/23/2023 03/15/14   Sheliah Hatch, MD  HYDROcodone-acetaminophen (NORCO/VICODIN) 5-325 MG per tablet Take 1 tablet by mouth every 4 (four) hours as needed. Patient not taking: Reported on 01/23/2023 12/26/13   Linwood Dibbles, MD  meclizine (ANTIVERT) 12.5 MG tablet Take 1 tablet (12.5 mg total) by mouth 3 (three) times daily as needed for dizziness. Patient not taking: Reported on 01/23/2023 08/28/21   Rolan Bucco, MD   Allergies  Allergen Reactions   Allopurinol Nausea Only and Other (See Comments)    "  Felt terrible all over"   Atenolol Other (See Comments)    Reaction not noted   Hydrochlorothiazide Other (See Comments)    Reaction not noted   Amlodipine Nausea Only   Lisinopril Nausea Only and Cough   Review of Systems +weakness  Physical Exam Elderly lady resting in bed Overall with generalized weakness Functional decline Regular work of breathing No edema Abdomen not distended  Vital Signs: BP 94/64 (BP Location: Right Arm)   Pulse 81   Temp 98.4 F (36.9 C) (Oral)   Resp 18   Ht 5\' 4"  (1.626 m)   Wt 69 kg   SpO2 98%   BMI 26.11 kg/m  Pain Scale: 0-10   Pain Score: 0-No pain   SpO2: SpO2: 98 % O2 Device:SpO2: 98 % O2 Flow Rate: .O2 Flow Rate (L/min): 2 L/min  IO: Intake/output summary:  Intake/Output Summary (Last 24 hours) at 01/29/2023 1432 Last data filed at 01/29/2023 1300 Gross per 24 hour  Intake 560 ml  Output 360 ml  Net 200 ml    LBM: Last BM Date : 01/28/23 Baseline Weight: Weight: 69.2 kg Most recent weight: Weight: 69 kg     Palliative Assessment/Data:   PPS 50%  Time In:  1330 Time Out:  1430 Time Total:  60  Greater than 50%  of this time was spent counseling and coordinating care related to the above assessment and  plan.  Signed by: Rosalin Hawking, MD   Please contact Palliative Medicine Team phone at [phone] for questions and concerns.  For individual provider: See Loretha Stapler

## 2023-01-29 NOTE — Progress Notes (Signed)
PROGRESS NOTE    Hayley Wilson  VVO:[id number] DOB: [birth year] DOA: 01/23/2023 PCP: Oneita Hurt, No   Brief Narrative:  Hayley Wilson is a 88 y.o. African-American female female with medical history significant for hypertension and recently treated UTI as well as presumed bladder malignancy seen on CT scan 10/9 at [facility] ER being admitted to the hospital with recurrent UTI, sepsis with acute renal failure. Patient continues to lose weight (reports 25lb weight loss in the last 90 days) - has become more dependent on family for ADLs. Recently completed a course of Keflex for presumed UTI after visit to [facility] ED.  She was told that she would be contacted by oncology office to schedule outpatient follow-up, but never received contact from them.  Subsequently she presented with tachycardia leukocytosis and was found to have another suspected UTI.  She is slowly improving PT OT recommending SNF.  Underwent a liver biopsy for her metastatic liver lesions and Biopsy Result still pending.  Overnight she started having Bloody stools and some hematuria but Hbg Stable.  GI and urology were consulted but patient declined any endoscopic procedures so GI signed off the case.  She also had some transient A-fib with RVR cardiology consulted At the case given her poor prognosis they are all recommending transitioning to hospice.  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  They are recommending a palliative care consult with placement in SNF with further hospice care at a later stage.  Palliative has met with the patient and performed a goals of care discussion and she remains full code at this point despite being aware of her seizures incurable nature of her illness.  Plan is for her to go to SNF for trial of rehab attempt with palliative services but likely she will be hospice eligible need hospice  services in the near future.  Assessment and Plan:  Severe Sepsis secondary to UTI, POA -Presented with Tachycardia, leukocytosis, with source is UTI.  She has associated AKI. -Urinalysis done on admission showed a cloudy appearance with large bilirubin, amber-colored, small glucose, 15 ketones, negative leukocytes, positive nitrites with many bacteria and 11-20 RBCs per high-power field and 6-10 WBCs -Inpatient admission to progressive -WBC Trend: Recent Labs  Lab 01/23/23 1001 01/24/23 0445 01/25/23 0412 01/26/23 0441 01/27/23 0413 01/28/23 0414 01/29/23 0404  WBC 26.3* 18.0* 12.0* 9.8 9.1 7.6 5.8  -Continuing empiric IV ceftriaxone and urine culture showed no growth -Blood cultures x 2 showing no growth to date at 3 days -PT OT recommending SNF and patient is considering SNF and 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 -Will need further goals of care discussion given her metastatic disease and with extremely poor prognosis and oncology is recommending  palliative care at SNF; palliative care met with the patient and patient continues to remain full code for now even though she is a very severe serious incurable illness and the nature and is going to discuss further with those consult for further guidance for medical recommendation for DNR/DNI.  She will go to SNF for rehabilitation at times with palliative services and the suspicion is that she will become fully hospice eligible and will need hospice services in the near future   AKI secondary to ATN/sepsis vs pre-renal/poor PO intake -BUN/Cr Trend: Recent Labs  Lab 01/23/23 1020 01/24/23 0445 01/25/23 0412 01/26/23 0441 01/27/23 0413 01/28/23 0414 01/29/23 0404  BUN 32* 27*  25* 20 19 17 14   CREATININE 1.30* 0.83 0.73 0.50 0.49 0.38* 0.52  -Patient received IV fluids in the ED -Liberalize diet, hold further IV fluid for left indicated given national shortage -Avoid Nephrotoxic Medications, Contrast Dyes,  Hypotension and Dehydration to Ensure Adequate Renal Perfusion and will need to Renally Adjust Meds -Renal function is relatively stable and will not repeat so can repeat in the outpatient setting  Transient A Fib with RVR -Troponin mildly flat and now normalized -Echo done -Cardiology recommends no anticoagulation at this time given hematuria and GI bleeding but have placed her on metoprolol 12.5 mg p.o. twice daily for better rate control -Given her prognosis they do not recommend aggressive workup -Cardiology has now signed off the case  Bladder mass with evidence of metastasis; ? diagnosis of metastatic bladder cancer vs other etiology highly suspicious for metastatic uterine cancer -Noted lymphadenopathy, bony and spinal mets -IR consulted at intake for potential biopsy, liver ultrasound done and shows multiple hypoechoic lesions up to 3 cm -Liver Bx done and showed metastatic carcinoma, high-grade serous of gynecologic origin  -18-gauge core biopsy x 2 right lobe liver mass 10/29 -Now having some Hematuria as well so will check a U/A and may need to speak with Urology  -Medical Oncology Consulted and they feel that her cancer is incurable and explained to the patient why surgery and radiation was not indicated and they recommended given her frail status that chemotherapy is not recommended; Dr. Bertis Ruddy also discussed the prognosis with the patient without treatment and recommends palliative care consult and placement in a skilled residential facility given that the family cannot take care of her health situation at this time. -Palliative care medicine consulted for further goals of care discussion   Hypertension -Holding home medications -currently normotensive -Continue to Monitor BP per Protocol -Last BP reading was on the softer side at 94/64  Hematuria -Urinalysis done and showed a cloudy appearance with amber color urine, negative glucose, large hemoglobin, 5 ketones, large  leukocytes, negative nitrites, many bacteria, 0-5 non-squamous epithelial cells, current 50 RBCs per high-power field and greater than 50 WBCs -Discontinued her subcu Lovenox -Vomiting of bladder as well as metastatic disease and they feel symptoms are coming from a bladder tumor and if she has undiagnosed hemorrhagic cystitis that she is already receiving antibiotics -They are performing a string of bottles to see him out hematuria that she is having -They are recommending observation at this point only and have now signed off the case  Hypophosphatemia -Phos Level Trend: Recent Labs  Lab 01/28/23 0414 01/29/23 0404  PHOS 1.5* 2.9  -Repelte -Continue to Monitor and Trend and repeat Phos in the AM  Maroon Stools -Hemoglobin is trending downwards.  FOBT is pending. -GI consulted for further evaluation and given that the patient has declined endoscopic intervention as they have signed off the case and recommending the patient go to hospice however patient is not hospice ready but will go to SNF with palliative care and likely eventually transition to hospice per currently she remains full code   Dysphagia, Odynophagia, new Questionable reflux versus regurgitation -Passed speech evaluation, advance diet as tolerated -GI consulted for GI bleeding as above -Currently patient is not interested in invasive procedures at this time  Normocytic Anemia -Now with Bloody stools overnight; Hgb/Hct Trend: Recent Labs  Lab 01/24/23 0445 01/25/23 0412 01/26/23 0441 01/27/23 0413 01/27/23 0852 01/28/23 0414 01/29/23 0404  HGB 9.5* 9.3* 9.3* 10.1* 9.7* 8.7* 8.6*  HCT 31.7* 30.6*  31.6* 34.4* 32.8* 29.1* 28.6*  MCV 87.3 86.7 88.5 89.4  --  88.7 87.5  -Checked Anemia Panel, UIBC 107, TIBC 129, saturation ratio 17%, ferritin level 583, folate level 5.5 and vitamin B12 451 -Check FOBT -Continue to Monitor for S/Sx of Bleeding; Now having Maroon stools -Hemoglobin is relatively stable but will not  check now and however follow-up in outpatient setting  Hypokalemia, improved  -Patient's K+ Level Trend: Recent Labs  Lab 01/23/23 1020 01/24/23 0445 01/25/23 0412 01/26/23 0441 01/27/23 0413 01/28/23 0414 01/29/23 0404  K 4.0 3.3* 3.1* 3.8 3.9 3.8 4.0  -Continue to Monitor and Replete as Necessary -Repeat CMP within 1 week SNF   Ambulatory Dysfunction with Associated Generalized weakness and Physical Decondition Moderate to severe protein caloric malnutrition -Likely in the setting of metastatic malignancy -PT OT following and recommending SNF; Patient to decide about SNF vs going home with Home Health and agreeable to consider SNF at [facility]; now that palliative has discussed with her if she can be discharged in the a.m.   DVT prophylaxis: SCDs Start: 01/23/23 1512    Code Status: Full Code Family Communication: Family at bedside resting  Disposition Plan:  Level of care: Progressive Status is: Inpatient Remains inpatient appropriate because: Needs SNF and now she has had palliative discussions can be discharged   Consultants:  Medical oncology Urology Gastroenterology Cardiology Palliative Care Medicine Interventional Radiology  Procedures:  Liver Biopsy   ECHOCARDIOGRAM IMPRESSIONS     1. Left ventricular ejection fraction, by estimation, is 65 to 70%. The  left ventricle has normal function. The left ventricle has no regional  wall motion abnormalities. Left ventricular diastolic parameters are  indeterminate.   2. Right ventricular systolic function is normal. The right ventricular  size is normal.   3. Trivial mitral valve regurgitation.   4. The aortic valve is normal in structure. Aortic valve regurgitation is  not visualized.   5. The inferior vena cava is normal in size with greater than 50%  respiratory variability, suggesting right atrial pressure of 3 mmHg.   FINDINGS   Left Ventricle: Left ventricular ejection fraction, by estimation, is  65  to 70%. The left ventricle has normal function. The left ventricle has no  regional wall motion abnormalities. The left ventricular internal cavity  size was normal in size. There is   no left ventricular hypertrophy. Left ventricular diastolic parameters  are indeterminate.   Right Ventricle: The right ventricular size is normal. Right vetricular  wall thickness was not assessed. Right ventricular systolic function is  normal.   Left Atrium: Left atrial size was normal in size.   Right Atrium: Right atrial size was normal in size.   Pericardium: There is no evidence of pericardial effusion.   Mitral Valve: There is mild thickening of the mitral valve leaflet(s).  Mild mitral annular calcification. Trivial mitral valve regurgitation.   Tricuspid Valve: The tricuspid valve is normal in structure. Tricuspid  valve regurgitation is mild.   Aortic Valve: The aortic valve is normal in structure. Aortic valve  regurgitation is not visualized.   Pulmonic Valve: The pulmonic valve was not well visualized. Pulmonic valve  regurgitation is not visualized. No evidence of pulmonic stenosis.   Aorta: The aortic root and ascending aorta are structurally normal, with  no evidence of dilitation.   Venous: The inferior vena cava is normal in size with greater than 50%  respiratory variability, suggesting right atrial pressure of 3 mmHg.   IAS/Shunts: No atrial level  shunt detected by color flow Doppler.     LEFT VENTRICLE  PLAX 2D  LVIDd:         2.90 cm   Diastology  LVIDs:         2.20 cm   LV e' medial:    6.20 cm/s  LV PW:         0.90 cm   LV E/e' medial:  1.0  LV IVS:        1.00 cm   LV e' lateral:   9.68 cm/s  LVOT diam:     1.90 cm   LV E/e' lateral: 0.6  LVOT Area:     2.84 cm     RIGHT VENTRICLE             IVC  RV S prime:     16.10 cm/s  IVC diam: 0.80 cm  TAPSE (M-mode): 1.9 cm   LEFT ATRIUM             Index        RIGHT ATRIUM           Index  LA diam:         2.20 cm 1.26 cm/m   RA Area:     11.70 cm  LA Vol (A2C):   36.9 ml 21.19 ml/m  RA Volume:   24.50 ml  14.07 ml/m  LA Vol (A4C):   26.2 ml 15.04 ml/m  LA Biplane Vol: 30.9 ml 17.74 ml/m     AORTA  Ao Root diam: 3.20 cm  Ao Asc diam:  2.90 cm   MV E velocity: 6.20 cm/s  TRICUSPID VALVE                            TR Peak grad:   27.7 mmHg                            TR Mean grad:   19.0 mmHg                            TR Vmax:        263.00 cm/s                            TR Vmean:       206.0 cm/s                              SHUNTS                            Systemic Diam: 1.90 cm   Antimicrobials:  Anti-infectives (From admission, onward)    Start     Dose/Rate Route Frequency Ordered Stop   01/23/23 1530  cefTRIAXone (ROCEPHIN) 1 g in sodium chloride 0.9 % 100 mL IVPB  Status:  Discontinued        1 g 200 mL/hr over 30 Minutes Intravenous Every 24 hours 01/23/23 1439 01/23/23 1440   01/23/23 1100  cefTRIAXone (ROCEPHIN) 2 g in sodium chloride 0.9 % 100 mL IVPB        2 g 200 mL/hr over 30 Minutes Intravenous Every 24 hours 01/23/23 1058 01/29/23 1110       Subjective: Seen and  examined at bedside she is feeling a little bit nauseous earlier but feels okay.  No chest pain or shortness of breath.  Continues to feel weak.  No other concerns or complaints at this time  Objective: Vitals:   01/28/23 1310 01/28/23 2036 01/29/23 0420 01/29/23 1143  BP: 122/80 126/84 121/82 94/64  Pulse: 99 91 80 81  Resp:  15 20 18   Temp: 98.2 F (36.8 C) 98.8 F (37.1 C) 98.2 F (36.8 C) 98.4 F (36.9 C)  TempSrc: Oral Oral Oral Oral  SpO2: 100% 100% 100% 98%  Weight:      Height:        Intake/Output Summary (Last 24 hours) at 01/29/2023 1913 Last data filed at 01/29/2023 1300 Gross per 24 hour  Intake 560 ml  Output 260 ml  Net 300 ml   Filed Weights   01/23/23 1000 01/23/23 1057  Weight: 69.2 kg 69 kg   Examination: Physical Exam:  Constitutional: Elderly chronically  ill-appearing female who appears extremely weak Respiratory: Diminished to auscultation bilaterally, no wheezing, rales, rhonchi or crackles. Normal respiratory effort and patient is not tachypenic. No accessory muscle use.  Unlabored breathing Cardiovascular: RRR, no murmurs / rubs / gallops. S1 and S2 auscultated. No extremity edema.  Abdomen: Soft, little tender to palpate in the abdomen but not very distended. Bowel sounds positive.  GU: Deferred. Musculoskeletal: No clubbing / cyanosis of digits/nails. No joint deformity upper and lower extremities Skin: No rashes, lesions, ulcers on limited skin evaluation. No induration; Warm and dry.  Neurologic: CN 2-12 grossly intact with no focal deficits. Romberg sign and cerebellar reflexes not assessed.  Psychiatric: Normal judgment and insight. Alert and oriented x 3. Normal mood and appropriate affect.   Data Reviewed: I have personally reviewed following labs and imaging studies  CBC: Recent Labs  Lab 01/23/23 1001 01/24/23 0445 01/25/23 0412 01/26/23 0441 01/27/23 0413 01/27/23 0852 01/28/23 0414 01/29/23 0404  WBC 26.3*   < > 12.0* 9.8 9.1  --  7.6 5.8  NEUTROABS 23.7*  --   --   --   --   --  5.2 3.8  HGB 12.4   < > 9.3* 9.3* 10.1* 9.7* 8.7* 8.6*  HCT 40.2   < > 30.6* 31.6* 34.4* 32.8* 29.1* 28.6*  MCV 84.6   < > 86.7 88.5 89.4  --  88.7 87.5  PLT 265   < > 195 195 176  --  186 172   < > = values in this interval not displayed.   Basic Metabolic Panel: Recent Labs  Lab 01/25/23 0412 01/26/23 0441 01/27/23 0413 01/28/23 0414 01/29/23 0404  NA 144 140 141 142 137  K 3.1* 3.8 3.9 3.8 4.0  CL 106 105 106 104 101  CO2 27 26 27 26 29   GLUCOSE 86 78 83 105* 92  BUN 25* 20 19 17 14   CREATININE 0.73 0.50 0.49 0.38* 0.52  CALCIUM 9.2 8.8* 9.0 8.9 8.4*  MG  --   --   --  2.0 1.8  PHOS  --   --   --  1.5* 2.9   GFR: Estimated Creatinine Clearance: 48.1 mL/min (by C-G formula based on SCr of 0.52 mg/dL). Liver Function  Tests: Recent Labs  Lab 01/23/23 1020 01/28/23 0414 01/29/23 0404  AST 31 24 25   ALT 15 13 13   ALKPHOS 95 67 64  BILITOT 1.2 0.7 0.4  PROT 8.0 6.0* 5.9*  ALBUMIN 2.7* 2.0* 1.9*   No results for  input(s): "LIPASE", "AMYLASE" in the last 168 hours. No results for input(s): "AMMONIA" in the last 168 hours. Coagulation Profile: Recent Labs  Lab 01/25/23 0412  INR 1.1   Cardiac Enzymes: No results for input(s): "CKTOTAL", "CKMB", "CKMBINDEX", "TROPONINI" in the last 168 hours. BNP (last 3 results) No results for input(s): "PROBNP" in the last 8760 hours. HbA1C: No results for input(s): "HGBA1C" in the last 72 hours. CBG: Recent Labs  Lab 01/23/23 1049  GLUCAP 95   Lipid Profile: No results for input(s): "CHOL", "HDL", "LDLCALC", "TRIG", "CHOLHDL", "LDLDIRECT" in the last 72 hours. Thyroid Function Tests: No results for input(s): "TSH", "T4TOTAL", "FREET4", "T3FREE", "THYROIDAB" in the last 72 hours. Anemia Panel: Recent Labs    01/28/23 0414  VITAMINB12 451  FOLATE 5.5*  FERRITIN 583*  TIBC 129*  IRON 22*  RETICCTPCT 0.9   Sepsis Labs: Recent Labs  Lab 01/23/23 1008 01/23/23 1626  LATICACIDVEN 1.5 1.9   Recent Results (from the past 240 hour(s))  Urine Culture     Status: None   Collection Time: 01/23/23 10:01 AM   Specimen: Urine, Catheterized  Result Value Ref Range Status   Specimen Description   Final    URINE, CATHETERIZED Performed at [facility], [address] [place], [place] [address]    Special Requests   Final    NONE Performed at [facility], [address] [place], [place] [address]    Culture   Final    NO GROWTH Performed at [facility], [address], [place], [place] [address]    Report Status 01/24/2023 FINAL  Final  Culture, blood (routine x 2)     Status: None   Collection Time: 01/23/23 10:20 AM   Specimen: BLOOD  Result Value Ref Range Status   Specimen Description   Final    BLOOD LEFT  ANTECUBITAL Performed at [facility], [address] [place], [place] [address]    Special Requests   Final    BOTTLES DRAWN AEROBIC AND ANAEROBIC Blood Culture adequate volume Performed at [facility], [address] [place], [place] [address]    Culture   Final    NO GROWTH 5 DAYS Performed at [facility], [address] [place], [place] [address]    Report Status 01/28/2023 FINAL  Final  Culture, blood (routine x 2)     Status: None   Collection Time: 01/23/23 10:20 AM   Specimen: BLOOD LEFT FOREARM  Result Value Ref Range Status   Specimen Description   Final    BLOOD LEFT FOREARM Performed at [facility], [address] [place], [place] [address]    Special Requests   Final    BOTTLES DRAWN AEROBIC ONLY Blood Culture results may not be optimal due to an inadequate volume of blood received in culture bottles Performed at [facility], [address] [place], [place] [address]    Culture   Final    NO GROWTH 5 DAYS Performed at [facility], [address], [place], [place] [address]    Report Status 01/28/2023 FINAL  Final    Radiology Studies: ECHOCARDIOGRAM COMPLETE  Result Date: 01/28/2023    ECHOCARDIOGRAM REPORT   Patient Name:   Hayley Wilson Date of Exam: 01/28/2023 Medical Rec #:  [id number]     Height:       64.0 in Accession #:  [id number]    Weight:       152.1 lb Date of Birth:  28-Feb-1935      BSA:          1.741 m Patient Age:    88 years      BP:           122/80 mmHg Patient Gender: F             HR:           99 bpm. Exam Location:  Inpatient Procedure: 2D Echo, Cardiac Doppler and Color Doppler Indications:    Atrial fibrillation  History:        Patient has no prior history of Echocardiogram examinations.                 Risk Factors:Hypertension. CA.  Sonographer:    Milda Smart Referring Phys: [id number] JOACZ LATIF [facility]  Sonographer Comments: Image acquisition challenging due to patient body  habitus and Image acquisition challenging due to respiratory motion. Suboptimal windows IMPRESSIONS  1. Left ventricular ejection fraction, by estimation, is 65 to 70%. The left ventricle has normal function. The left ventricle has no regional wall motion abnormalities. Left ventricular diastolic parameters are indeterminate.  2. Right ventricular systolic function is normal. The right ventricular size is normal.  3. Trivial mitral valve regurgitation.  4. The aortic valve is normal in structure. Aortic valve regurgitation is not visualized.  5. The inferior vena cava is normal in size with greater than 50% respiratory variability, suggesting right atrial pressure of 3 mmHg. FINDINGS  Left Ventricle: Left ventricular ejection fraction, by estimation, is 65 to 70%. The left ventricle has normal function. The left ventricle has no regional wall motion abnormalities. The left ventricular internal cavity size was normal in size. There is  no left ventricular hypertrophy. Left ventricular diastolic parameters are indeterminate. Right Ventricle: The right ventricular size is normal. Right vetricular wall thickness was not assessed. Right ventricular systolic function is normal. Left Atrium: Left atrial size was normal in size. Right Atrium: Right atrial size was normal in size. Pericardium: There is no evidence of pericardial effusion. Mitral Valve: There is mild thickening of the mitral valve leaflet(s). Mild mitral annular calcification. Trivial mitral valve regurgitation. Tricuspid Valve: The tricuspid valve is normal in structure. Tricuspid valve regurgitation is mild. Aortic Valve: The aortic valve is normal in structure. Aortic valve regurgitation is not visualized. Pulmonic Valve: The pulmonic valve was not well visualized. Pulmonic valve regurgitation is not visualized. No evidence of pulmonic stenosis. Aorta: The aortic root and ascending aorta are structurally normal, with no evidence of dilitation. Venous: The  inferior vena cava is normal in size with greater than 50% respiratory variability, suggesting right atrial pressure of 3 mmHg. IAS/Shunts: No atrial level shunt detected by color flow Doppler.  LEFT VENTRICLE PLAX 2D LVIDd:         2.90 cm   Diastology LVIDs:         2.20 cm   LV e' medial:    6.20 cm/s LV PW:         0.90 cm   LV E/e' medial:  1.0 LV IVS:        1.00 cm   LV e' lateral:   9.68 cm/s LVOT diam:     1.90 cm   LV E/e' lateral: 0.6 LVOT Area:     2.84 cm  RIGHT VENTRICLE  IVC RV S prime:     16.10 cm/s  IVC diam: 0.80 cm TAPSE (M-mode): 1.9 cm LEFT ATRIUM             Index        RIGHT ATRIUM           Index LA diam:        2.20 cm 1.26 cm/m   RA Area:     11.70 cm LA Vol (A2C):   36.9 ml 21.19 ml/m  RA Volume:   24.50 ml  14.07 ml/m LA Vol (A4C):   26.2 ml 15.04 ml/m LA Biplane Vol: 30.9 ml 17.74 ml/m   AORTA Ao Root diam: 3.20 cm Ao Asc diam:  2.90 cm MV E velocity: 6.20 cm/s  TRICUSPID VALVE                           TR Peak grad:   27.7 mmHg                           TR Mean grad:   19.0 mmHg                           TR Vmax:        263.00 cm/s                           TR Vmean:       206.0 cm/s                            SHUNTS                           Systemic Diam: 1.90 cm Dietrich Pates MD Electronically signed by Dietrich Pates MD Signature Date/Time: 01/28/2023/4:15:23 PM    Final     Scheduled Meds:  feeding supplement  237 mL Oral TID BM   Gerhardt's butt cream   Topical TID   metoprolol tartrate  12.5 mg Oral BID   multivitamin with minerals  1 tablet Oral Daily   mouth rinse  15 mL Mouth Rinse 4 times per day   potassium chloride  40 mEq Oral Once   Continuous Infusions:  lactated ringers      LOS: 6 days   Marguerita Merles, DO [facility] Available via Epic secure chat 7am-7pm After these hours, please refer to coverage provider listed on [email or web address] 01/29/2023, 7:13 PM

## 2023-01-29 NOTE — Consult Note (Signed)
Urology Consult Note   Requesting Attending Physician:  Merlene Laughter, DO Service Providing Consult: Urology  Consulting Attending: Dr. Pete Glatter   Reason for Consult:  hematuria  HPI: Hayley Wilson is seen in consultation for reasons noted above at the request of Merlene Laughter, DO.  Patient initially presented to [facility].  They were called to the house after patient's 88 year old grandson/caretaker found her soiled of stool and urine and report of no intake of food or drink.  Workup showed leukocytosis, AKI, and abnormal urinalysis.  She was started empirically on Rocephin and admitted to the hospitalist service.  Of note, she presented to [facility] [place] on 01/05/2023 for assessment of suprapubic pain.  CT A/P noted irregular wall thickening throughout the bladder, most prominently along the posterior, inferior, and right lateral aspect suggestive of primary malignancy.  Likely metastatic disease and abdominal/pelvic lymph nodes, hepatic and pulmonary metastasis, and possibly left femoral shaft.  She was referred to an oncologist but has not followed up yet.  There has been an unintended weight loss of 25 pounds over the last couple months.  On my assessment this morning patient was alert and oriented, though she gave short vague responses.  Update 01/29/2023: Pt without issues with voiding. Racked urine light pink and thin without clots.  Oncology evaluated pt yesterday, also recommending palliative referral.  Hemoglobin stable,has not required transfusions.   ------------------  Assessment:   88 y.o. female with hematuria in the context of metastatic bladder cancer   Recommendations:  # Hematuria Undifferentiated but most likely coming from bladder tumor.  If there is an undiagnosed hemorrhagic cystitis, will be covered by the Rocephin she is already receiving.  Urine remains light pink - no further need for rack and recorded urine.  Can obtain bladder scan PRN as necessary   Trend H&H, 10.1-->9.7-->8.7  Pathology report from liver biopsy reveals metastatic carcinoma, high-grade serous of gynecologic origin.  No surgical indications at this time.  I doubt she will be able to tolerate chemotherapy, will defer to oncology.  Palliative has been consulted.  Considering her hematuria and GI bleeding, I would discontinue her Q8 heparin injections if medically reasonable.  We will sign off at this time  Case and plan discussed with Dr. Pete Glatter  Past Medical History: Past Medical History:  Diagnosis Date   Asthma    Cancer [facility])    Hypertension     Past Surgical History:  Past Surgical History:  Procedure Laterality Date   IR US LIVER BIOPSY  01/25/2023   KNEE SURGERY     ORIF FIBULA FRACTURE      Medication: Current Facility-Administered Medications  Medication Dose Route Frequency Provider Last Rate Last Admin   acetaminophen (TYLENOL) tablet 650 mg  650 mg Oral Q6H PRN Kirby Crigler, Mir M, MD       Or   acetaminophen (TYLENOL) suppository 650 mg  650 mg Rectal Q6H PRN Kirby Crigler, Mir M, MD       albuterol (PROVENTIL) (2.5 MG/3ML) 0.083% nebulizer solution 2.5 mg  2.5 mg Nebulization Q2H PRN Kirby Crigler, Mir M, MD       alum & mag hydroxide-simeth (MAALOX/MYLANTA) 200-200-20 MG/5ML suspension 30 mL  30 mL Oral Q6H PRN Marguerita Merles Latif, DO   30 mL at 01/26/23 1554   cefTRIAXone (ROCEPHIN) 2 g in sodium chloride 0.9 % 100 mL IVPB  2 g Intravenous Q24H Jacalyn Lefevre, MD 200 mL/hr at 01/28/23 0930 2 g at 01/28/23  0930   feeding supplement (ENSURE ENLIVE / ENSURE PLUS) liquid 237 mL  237 mL Oral TID BM Kirby Crigler, Mir M, MD   237 mL at 01/27/23 4782   Gerhardt's butt cream   Topical TID Merlene Laughter, DO   Given at 01/28/23 2043   hydrALAZINE (APRESOLINE) injection 5 mg  5 mg Intravenous Q6H PRN Kirby Crigler, Mir M, MD       lactated ringers bolus 250 mL  250 mL Intravenous Once Jacalyn Lefevre, MD       lip  balm (CARMEX) ointment   Topical PRN Marguerita Merles Dormont, DO   1 Application at 01/27/23 1402   metoprolol tartrate (LOPRESSOR) tablet 12.5 mg  12.5 mg Oral BID Azalee Course, PA   12.5 mg at 01/28/23 2044   multivitamin with minerals tablet 1 tablet  1 tablet Oral Daily Marguerita Merles [place], DO   1 tablet at 01/28/23 0925   ondansetron (ZOFRAN) tablet 4 mg  4 mg Oral Q6H PRN Kirby Crigler, Mir M, MD       Or   ondansetron [facility]) injection 4 mg  4 mg Intravenous Q6H PRN Kirby Crigler, Mir M, MD   4 mg at 01/29/23 0408   potassium chloride SA (KLOR-CON M) CR tablet 40 mEq  40 mEq Oral Once Azucena Fallen, MD       traZODone (DESYREL) tablet 25 mg  25 mg Oral QHS PRN Maryln Gottron, MD   25 mg at 01/26/23 2200    Allergies: Allergies  Allergen Reactions   Allopurinol Nausea Only and Other (See Comments)    "Felt terrible all over"   Atenolol Other (See Comments)    Reaction not noted   Hydrochlorothiazide Other (See Comments)    Reaction not noted   Amlodipine Nausea Only   Lisinopril Nausea Only and Cough    Social History: Social History   Tobacco Use   Smoking status: Never   Smokeless tobacco: Never  Substance Use Topics   Alcohol use: No   Drug use: No    Family History Family History  Problem Relation Age of Onset   Hypertension Mother    Arthritis Mother    Diabetes Father    Stroke Maternal Grandmother    Arthritis Maternal Aunt    Diabetes Paternal Uncle     Review of Systems  Genitourinary:  Positive for hematuria.     Objective   Vital signs in last 24 hours: BP 121/82 (BP Location: Right Arm)   Pulse 80   Temp 98.2 F (36.8 C) (Oral)   Resp 20   Ht 5\' 4"  (1.626 m)   Wt 69 kg   SpO2 100%   BMI 26.11 kg/m   Physical Exam General: Cachectic, temporal wasting, A&O HEENT: [place]/AT Pulmonary: Normal work of breathing Cardiovascular: no cyanosis GU: Clear light red urine with no clot material. Neuro: Appropriate, no focal neurological  deficits  Most Recent Labs: Lab Results  Component Value Date   WBC 5.8 01/29/2023   HGB 8.6 (L) 01/29/2023   HCT 28.6 (L) 01/29/2023   PLT 172 01/29/2023    Lab Results  Component Value Date   NA 137 01/29/2023   K 4.0 01/29/2023   CL 101 01/29/2023   CO2 29 01/29/2023   BUN 14 01/29/2023   CREATININE 0.52 01/29/2023   CALCIUM 8.4 (L) 01/29/2023   MG 1.8 01/29/2023   PHOS 2.9 01/29/2023    Lab Results  Component Value Date   INR 1.1 01/25/2023  Urine Culture: @[id number] (laburin,org,r9620,r9621)@   IMAGING: ECHOCARDIOGRAM COMPLETE  Result Date: 01/28/2023    ECHOCARDIOGRAM REPORT   Patient Name:   Hayley Wilson Date of Exam: 01/28/2023 Medical Rec #:  [id number]     Height:       64.0 in Accession #:    [id number]    Weight:       152.1 lb Date of Birth:  Aug 16, 1934      BSA:          1.741 m Patient Age:    88 years      BP:           122/80 mmHg Patient Gender: F             HR:           99 bpm. Exam Location:  Inpatient Procedure: 2D Echo, Cardiac Doppler and Color Doppler Indications:    Atrial fibrillation  History:        Patient has no prior history of Echocardiogram examinations.                 Risk Factors:Hypertension. CA.  Sonographer:    Milda Smart Referring Phys: [id number] HYQMV LATIF [facility]  Sonographer Comments: Image acquisition challenging due to patient body habitus and Image acquisition challenging due to respiratory motion. Suboptimal windows IMPRESSIONS  1. Left ventricular ejection fraction, by estimation, is 65 to 70%. The left ventricle has normal function. The left ventricle has no regional wall motion abnormalities. Left ventricular diastolic parameters are indeterminate.  2. Right ventricular systolic function is normal. The right ventricular size is normal.  3. Trivial mitral valve regurgitation.  4. The aortic valve is normal in structure. Aortic valve regurgitation is not visualized.  5. The inferior vena cava is normal in size with greater than  50% respiratory variability, suggesting right atrial pressure of 3 mmHg. FINDINGS  Left Ventricle: Left ventricular ejection fraction, by estimation, is 65 to 70%. The left ventricle has normal function. The left ventricle has no regional wall motion abnormalities. The left ventricular internal cavity size was normal in size. There is  no left ventricular hypertrophy. Left ventricular diastolic parameters are indeterminate. Right Ventricle: The right ventricular size is normal. Right vetricular wall thickness was not assessed. Right ventricular systolic function is normal. Left Atrium: Left atrial size was normal in size. Right Atrium: Right atrial size was normal in size. Pericardium: There is no evidence of pericardial effusion. Mitral Valve: There is mild thickening of the mitral valve leaflet(s). Mild mitral annular calcification. Trivial mitral valve regurgitation. Tricuspid Valve: The tricuspid valve is normal in structure. Tricuspid valve regurgitation is mild. Aortic Valve: The aortic valve is normal in structure. Aortic valve regurgitation is not visualized. Pulmonic Valve: The pulmonic valve was not well visualized. Pulmonic valve regurgitation is not visualized. No evidence of pulmonic stenosis. Aorta: The aortic root and ascending aorta are structurally normal, with no evidence of dilitation. Venous: The inferior vena cava is normal in size with greater than 50% respiratory variability, suggesting right atrial pressure of 3 mmHg. IAS/Shunts: No atrial level shunt detected by color flow Doppler.  LEFT VENTRICLE PLAX 2D LVIDd:         2.90 cm   Diastology LVIDs:         2.20 cm   LV e' medial:    6.20 cm/s LV PW:         0.90 cm   LV E/e' medial:  1.0 LV IVS:  1.00 cm   LV e' lateral:   9.68 cm/s LVOT diam:     1.90 cm   LV E/e' lateral: 0.6 LVOT Area:     2.84 cm  RIGHT VENTRICLE             IVC RV S prime:     16.10 cm/s  IVC diam: 0.80 cm TAPSE (M-mode): 1.9 cm LEFT ATRIUM             Index         RIGHT ATRIUM           Index LA diam:        2.20 cm 1.26 cm/m   RA Area:     11.70 cm LA Vol (A2C):   36.9 ml 21.19 ml/m  RA Volume:   24.50 ml  14.07 ml/m LA Vol (A4C):   26.2 ml 15.04 ml/m LA Biplane Vol: 30.9 ml 17.74 ml/m   AORTA Ao Root diam: 3.20 cm Ao Asc diam:  2.90 cm MV E velocity: 6.20 cm/s  TRICUSPID VALVE                           TR Peak grad:   27.7 mmHg                           TR Mean grad:   19.0 mmHg                           TR Vmax:        263.00 cm/s                           TR Vmean:       206.0 cm/s                            SHUNTS                           Systemic Diam: 1.90 cm Dietrich Pates MD Electronically signed by Dietrich Pates MD Signature Date/Time: 01/28/2023/4:15:23 PM    Final     ------  Elmon Kirschner, NP Pager: [phone]   Please contact the urology consult pager with any further questions/concerns.

## 2023-01-29 NOTE — Plan of Care (Signed)

## 2023-01-30 DIAGNOSIS — E440 Moderate protein-calorie malnutrition: Secondary | ICD-10-CM | POA: Diagnosis not present

## 2023-01-30 DIAGNOSIS — C55 Malignant neoplasm of uterus, part unspecified: Secondary | ICD-10-CM | POA: Diagnosis not present

## 2023-01-30 DIAGNOSIS — A419 Sepsis, unspecified organism: Secondary | ICD-10-CM | POA: Diagnosis not present

## 2023-01-30 DIAGNOSIS — K921 Melena: Secondary | ICD-10-CM | POA: Diagnosis not present

## 2023-01-30 MED ORDER — ENSURE ENLIVE PO LIQD: 237.0000 mL | Freq: Three times a day (TID) | ORAL | 12 refills | Status: AC

## 2023-01-30 MED ORDER — GERHARDT'S BUTT CREAM: 1.0000 | TOPICAL_CREAM | Freq: Three times a day (TID) | CUTANEOUS | Status: AC

## 2023-01-30 MED ORDER — CARMEX CLASSIC LIP BALM EX OINT: TOPICAL_OINTMENT | CUTANEOUS | Status: AC | PRN

## 2023-01-30 MED ORDER — METOPROLOL TARTRATE 25 MG PO TABS: 12.5000 mg | ORAL_TABLET | Freq: Two times a day (BID) | ORAL | 0 refills | Status: AC

## 2023-01-30 MED ORDER — HYDROCODONE-ACETAMINOPHEN 5-325 MG PO TABS: 1.0000 | ORAL_TABLET | Freq: Four times a day (QID) | ORAL | 0 refills | Status: AC | PRN

## 2023-01-30 MED ORDER — ALUM & MAG HYDROXIDE-SIMETH 200-200-20 MG/5ML PO SUSP: 30.0000 mL | Freq: Four times a day (QID) | ORAL | 0 refills | Status: AC | PRN

## 2023-01-30 MED ORDER — ONDANSETRON HCL 4 MG PO TABS: 4.0000 mg | ORAL_TABLET | Freq: Four times a day (QID) | ORAL | 0 refills | Status: AC | PRN

## 2023-01-30 NOTE — TOC Progression Note (Signed)
Transition of Care [facility]) - Progression Note    Patient Details  Name: Hayley Wilson MRN: [id number] Date of Birth: [birth year]  [facility]) CM/SW Contact  Darleene Cleaver, [place] Phone Number: 01/30/2023, 9:43 AM  Clinical Narrative:     CSW was informed that patient may be ready for discharge today.  CSW contacted SNF they can accept her as long as DC summary is completed by 1pm.  CSW updated attending physician.   Expected Discharge Plan: Skilled Nursing Facility Barriers to Discharge: Continued Medical Work up  Expected Discharge Plan and Services  SNF with palliative to follow.     Living arrangements for the past 2 months: Single Family Home                                       Social Determinants of Health (SDOH) Interventions SDOH Screenings   Food Insecurity: No Food Insecurity (01/28/2023)  Housing: Low Risk  (01/28/2023)  Transportation Needs: No Transportation Needs (01/28/2023)  Utilities: Not At Risk (01/28/2023)  Social Connections: Unknown (04/26/2022)   Received from [facility]  Tobacco Use: Low Risk  (01/23/2023)    Readmission Risk Interventions     No data to display

## 2023-01-30 NOTE — Plan of Care (Signed)

## 2023-01-30 NOTE — Progress Notes (Addendum)
Attempted to call report to SNF x2 with no answer. Left VM for nurse supervisor at [phone], awaiting return phone call. Also awaiting [facility].   1415- [facility] called back, report given to nurse Shadequa. Still awaiting [facility] pick-up.

## 2023-01-30 NOTE — TOC Transition Note (Addendum)
Transition of Care [facility]) - CM/SW Discharge Note   Patient Details  Name: Hayley Wilson MRN: [id number] Date of Birth: Sep 19, 1934  [facility]) CM/SW Contact:  Darleene Cleaver, LCSW Phone Number: 01/30/2023, 12:39 PM   Clinical Narrative:     CSW sent DC summary to [facility], and also printed a hard copy for the patient's packet.  Patient to be d/c'ed today to [facility] room 609B.  Patient and family agreeable to plans will transport via ems RN to call report to [phone].  CSW spoke to Bryon Lions, family member [phone] to inform him that patient is discharging today.    EMS called at 12:50pm, [phone]   Final next level of care: Skilled Nursing Facility Barriers to Discharge: Barriers Resolved   Patient Goals and CMS Choice CMS Medicare.gov Compare Post Acute Care list provided to:: Patient Choice offered to / list presented to : Patient, Adult Children  Discharge Placement  [facility].   Existing PASRR number confirmed : 01/26/23          Patient chooses bed at: [facility] Patient to be transferred to facility by: [facility] Name of family member notified: Bryon Lions, [phone] Patient and family notified of of transfer: 01/30/23  Discharge Plan and Services Additional resources added to the After Visit Summary for                                       Social Determinants of Health (SDOH) Interventions SDOH Screenings   Food Insecurity: No Food Insecurity (01/28/2023)  Housing: Low Risk  (01/28/2023)  Transportation Needs: No Transportation Needs (01/28/2023)  Utilities: Not At Risk (01/28/2023)  Social Connections: Unknown (04/26/2022)   Received from [facility]  Tobacco Use: Low Risk  (01/23/2023)     Readmission Risk Interventions     No data to display

## 2023-01-30 NOTE — Discharge Summary (Signed)
Physician Discharge Summary   Patient: Hayley Wilson MRN: [id number] DOB: 15-Jan-1935  Admit date:     01/23/2023  Discharge date: 01/30/23  Discharge Physician: Marguerita Merles, DO   PCP: Pcp, No   Recommendations at discharge:   Follow up with PCP within 1-2 weeks and repeat CBC,CMP,Mag,Phos within 1 week Follow up with Palliative Care Medicine at SNF  Discharge Diagnoses: Principal Problem:   Sepsis (HCC) Active Problems:   Malnutrition of moderate degree   Malignant neoplasm of uterus (HCC)   Melena  Resolved Problems:   * No resolved hospital problems. *  Hospital Course: Hayley Wilson is a 88 y.o. African-American female female with medical history significant for hypertension and recently treated UTI as well as presumed bladder malignancy seen on CT scan 10/9 at [facility] being admitted to the hospital with recurrent UTI, sepsis with acute renal failure. Patient continues to lose weight (reports 25lb weight loss in the last 90 days) - has become more dependent on family for ADLs. Recently completed a course of Keflex for presumed UTI after visit to [facility] ED.  She was told that she would be contacted by oncology office to schedule outpatient follow-up, but never received contact from them.  Subsequently she presented with tachycardia leukocytosis and was found to have another suspected UTI.  She is slowly improving PT OT recommending SNF.  Underwent a liver biopsy for her metastatic liver lesions and Biopsy Result still pending.  Overnight she started having Bloody stools and some hematuria but Hbg Stable.  GI and urology were consulted but patient declined any endoscopic procedures so GI signed off the case.  She also had some transient A-fib with RVR cardiology consulted At the case given her poor prognosis they are all recommending transitioning to hospice.  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  They are recommending a palliative care consult with placement in SNF with further hospice care at a later stage.  Palliative has met with the patient and performed a goals of care discussion and she remains full code at this point despite being aware of her seizures incurable nature of her illness. She is medically stable for D/C and Plan is for her to go to SNF for trial of rehab attempt with palliative services but likely she will be hospice eligible need hospice services in the near future.  Assessment and Plan:  Severe Sepsis secondary to UTI, POA -Presented with Tachycardia, leukocytosis, with source is UTI.  She has associated AKI. -Urinalysis done on admission showed a cloudy appearance with large bilirubin, amber-colored, small glucose, 15 ketones, negative leukocytes, positive nitrites with many bacteria and 11-20 RBCs per high-power field and 6-10 WBCs; Urine Repeated and showed many bacteria, >50 WBC, Large Hgb, Large Leukocytes -Inpatient admission to progressive -WBC Trend: Recent Labs  Lab 01/23/23 1001 01/24/23 0445 01/25/23 0412 01/26/23 0441 01/27/23 0413 01/28/23 0414 01/29/23 0404  WBC 26.3* 18.0* 12.0* 9.8 9.1 7.6 5.8  -Continued empiric IV ceftriaxone for 7 days and urine culture showed no growth -Blood cultures x 2 showing no growth to date at 5 days -PT OT recommending SNF and patient is considering SNF and 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 -Will need further goals of care discussion given her metastatic disease and with extremely poor prognosis and oncology is recommending  palliative care at [facility]; palliative care met with the patient and patient  continues to remain full code for now even though she is a very severe serious incurable illness and the nature and is going to discuss further with those consult for further guidance for medical recommendation for DNR/DNI.  She will go to SNF for  rehabilitation at times with palliative services and the suspicion is that she will become fully hospice eligible and will need hospice services in the near future   AKI secondary to ATN/sepsis vs pre-renal/poor PO intake -BUN/Cr Trend: Recent Labs  Lab 01/23/23 1020 01/24/23 0445 01/25/23 0412 01/26/23 0441 01/27/23 0413 01/28/23 0414 01/29/23 0404  BUN 32* 27* 25* 20 19 17 14   CREATININE 1.30* 0.83 0.73 0.50 0.49 0.38* 0.52  -Patient received IV fluids in the ED -Liberalize diet, hold further IV fluid for left indicated given national shortage -Avoid Nephrotoxic Medications, Contrast Dyes, Hypotension and Dehydration to Ensure Adequate Renal Perfusion and will need to Renally Adjust Meds -Renal function is relatively stable and will not repeat so can repeat in the outpatient setting  Transient A Fib with RVR -Troponin mildly flat and now normalized -Echo done -Cardiology recommends no anticoagulation at this time given hematuria and GI bleeding but have placed her on metoprolol 12.5 mg p.o. twice daily for better rate control -Given her prognosis they do not recommend aggressive workup -Cardiology has now signed off the case  Bladder mass with evidence of metastasis; ? diagnosis of metastatic bladder cancer vs other etiology highly suspicious for metastatic uterine cancer -Noted lymphadenopathy, bony and spinal mets -IR consulted at intake for potential biopsy, liver ultrasound done and shows multiple hypoechoic lesions up to 3 cm -Liver Bx done and showed metastatic carcinoma, high-grade serous of gynecologic origin  -18-gauge core biopsy x 2 right lobe liver mass 10/29 -Now having some Hematuria as well so will check a U/A and may need to speak with Urology  -Medical Oncology Consulted and they feel that her cancer is incurable and explained to the patient why surgery and radiation was not indicated and they recommended given her frail status that chemotherapy is not recommended;  Dr. Bertis Ruddy also discussed the prognosis with the patient without treatment and recommends palliative care consult and placement in a skilled residential facility given that the family cannot take care of her health situation at this time. -Palliative care medicine consulted for further goals of care discussion   Hypertension -Holding home medications -currently normotensive -Continue to Monitor BP per Protocol -Last BP reading was on the softer side at 94/64  Hematuria -Urinalysis done and showed a cloudy appearance with amber color urine, negative glucose, large hemoglobin, 5 ketones, large leukocytes, negative nitrites, many bacteria, 0-5 non-squamous epithelial cells, current 50 RBCs per high-power field and greater than 50 WBCs -Discontinued her subcu Lovenox -Vomiting of bladder as well as metastatic disease and they feel symptoms are coming from a bladder tumor and if she has undiagnosed hemorrhagic cystitis that she is already receiving antibiotics -They are performing a string of bottles to see him out hematuria that she is having -They are recommending observation at this point only and have now signed off the case  Hypophosphatemia -Phos Level Trend: Recent Labs  Lab 01/28/23 0414 01/29/23 0404  PHOS 1.5* 2.9  -Repelte -Continue to Monitor and Trend and repeat Phos in the AM  Maroon Stools -Hemoglobin is trending downwards.  FOBT is pending. -GI consulted for further evaluation and given that the patient has declined endoscopic intervention as they have signed off the case and recommending the patient  go to hospice however patient is not hospice ready but will go to SNF with palliative care and likely eventually transition to hospice per currently she remains full code   Dysphagia, Odynophagia, new Questionable reflux versus regurgitation -Passed speech evaluation, advance diet as tolerated -GI consulted for GI bleeding as above -Currently patient is not interested in  invasive procedures at this time  Normocytic Anemia -Now with Bloody stools overnight; Hgb/Hct Trend: Recent Labs  Lab 01/24/23 0445 01/25/23 0412 01/26/23 0441 01/27/23 0413 01/27/23 0852 01/28/23 0414 01/29/23 0404  HGB 9.5* 9.3* 9.3* 10.1* 9.7* 8.7* 8.6*  HCT 31.7* 30.6* 31.6* 34.4* 32.8* 29.1* 28.6*  MCV 87.3 86.7 88.5 89.4  --  88.7 87.5  -Checked Anemia Panel, UIBC 107, TIBC 129, saturation ratio 17%, ferritin level 583, folate level 5.5 and vitamin B12 451 -Check FOBT -Continue to Monitor for S/Sx of Bleeding; Now having Maroon stools -Hemoglobin is relatively stable but will not check now and however follow-up in outpatient setting  Hypokalemia, improved  -Patient's K+ Level Trend: Recent Labs  Lab 01/23/23 1020 01/24/23 0445 01/25/23 0412 01/26/23 0441 01/27/23 0413 01/28/23 0414 01/29/23 0404  K 4.0 3.3* 3.1* 3.8 3.9 3.8 4.0  -Continue to Monitor and Replete as Necessary -Repeat CMP within 1 week SNF   Ambulatory Dysfunction with Associated Generalized weakness and Physical Decondition Moderate to severe protein caloric malnutrition -Likely in the setting of metastatic malignancy -PT OT following and recommending SNF; Patient to decide about SNF vs going home with Home Health and agreeable to consider SNF at [facility]; now that palliative has discussed with her she will be discharged this AM  Nutrition Documentation    Flowsheet Row ED to Hosp-Admission (Current) from 01/23/2023 in [facility] 4TH FLOOR PROGRESSIVE CARE AND UROLOGY  Nutrition Problem Moderate Malnutrition  Etiology chronic illness  Nutrition Goal Patient will meet greater than or equal to 90% of their needs  Interventions Refer to RD note for recommendations, Ensure Enlive (each supplement provides 350kcal and 20 grams of protein), Magic cup, MVI      Consultants:  Medical oncology Urology Gastroenterology Cardiology Palliative Care Medicine Interventional Radiology  Procedures  performed: Liver Bx; ECHOCARDIOGRAM  Disposition: Skilled nursing facility  Diet recommendation:  Dysphagia type 2 Thin Liquid  DISCHARGE MEDICATION: Allergies as of 01/30/2023       Reactions   Allopurinol Nausea Only, Other (See Comments)   "Felt terrible all over"   Atenolol Other (See Comments)   Reaction not noted   Hydrochlorothiazide Other (See Comments)   Reaction not noted   Amlodipine Nausea Only   Lisinopril Nausea Only, Cough        Medication List     STOP taking these medications    hydrochlorothiazide 12.5 MG tablet Commonly known as: HYDRODIURIL   meclizine 12.5 MG tablet Commonly known as: ANTIVERT       TAKE these medications    alum & mag hydroxide-simeth 200-200-20 MG/5ML suspension Commonly known as: MAALOX/MYLANTA Take 30 mLs by mouth every 6 (six) hours as needed for indigestion, heartburn or flatulence.   brimonidine 0.2 % ophthalmic solution Commonly known as: ALPHAGAN Place 1 drop into both eyes 3 (three) times daily.   dorzolamide-timolol 2-0.5 % ophthalmic solution Commonly known as: COSOPT Place 1 drop into both eyes 2 (two) times daily.   feeding supplement Liqd Take 237 mLs by mouth 3 (three) times daily between meals.   Gerhardt's butt cream Crea Apply 1 Application topically 3 (three) times daily.   HYDROcodone-acetaminophen  5-325 MG tablet Commonly known as: NORCO/VICODIN Take 1 tablet by mouth every 6 (six) hours as needed for up to 3 days. What changed: when to take this   latanoprost 0.005 % ophthalmic solution Commonly known as: XALATAN Place 1 drop into both eyes at bedtime.   lip balm ointment Apply topically as needed.   metoprolol tartrate 25 MG tablet Commonly known as: LOPRESSOR Take 0.5 tablets (12.5 mg total) by mouth 2 (two) times daily.   Multivitamin Womens 50+ Adv Tabs Take 1 tablet by mouth daily with breakfast.   ondansetron 4 MG tablet Commonly known as: ZOFRAN Take 1 tablet (4 mg total)  by mouth every 6 (six) hours as needed for nausea.   TYLENOL 500 MG tablet Generic drug: acetaminophen Take 500 mg by mouth every 6 (six) hours as needed for mild pain (pain score 1-3) or headache.        Contact information for after-discharge care     Destination     [facility]erred SNF .   Service: Skilled Nursing Contact information: [address] Test Update Address [place] [address] [phone]                    Discharge Exam: Ceasar Mons Weights   01/23/23 1000 01/23/23 1057  Weight: 69.2 kg 69 kg   Vitals:   01/29/23 2048 01/30/23 0456  BP: 107/69 130/80  Pulse: 80 78  Resp: 16 15  Temp: 98.1 F (36.7 C) 98.2 F (36.8 C)  SpO2: 99% 100%   Examination: Physical Exam:  Constitutional: Elderly chronically-ill appearing female who is extremely weak and fatigued appearing Respiratory: Diminished to auscultation bilaterally, no wheezing, rales, rhonchi or crackles. Normal respiratory effort and patient is not tachypenic. No accessory muscle use. Unlabored breathing  Cardiovascular: RRR, no murmurs / rubs / gallops. S1 and S2 auscultated. No extremity edema. Abdomen: Soft, non-tender, non-distended. Bowel sounds positive.  GU: Deferred. Musculoskeletal: No clubbing / cyanosis of digits/nails. No joint deformity upper and lower extremities. Skin: No rashes, lesions, ulcers on a limited skin evaluation. No induration; Warm and dry.  Neurologic: CN 2-12 grossly intact with no focal deficits. Romberg sign and cerebellar reflexes not assessed.  Psychiatric: Normal judgment and insight. Alert and oriented x 3. Normal mood and appropriate affect.   Condition at discharge:  Guarded; Currently stable but has a very poor prognosis  The results of significant diagnostics from this hospitalization (including imaging, microbiology, ancillary and laboratory) are listed below for reference.   Imaging Studies: ECHOCARDIOGRAM  COMPLETE  Result Date: 01/28/2023    ECHOCARDIOGRAM REPORT   Patient Name:   Hayley Wilson Date of Exam: 01/28/2023 Medical Rec #:  [id number]     Height:       64.0 in Accession #:    [id number]    Weight:       152.1 lb Date of Birth:  [birth year]      BSA:          1.741 m Patient Age:    88 years      BP:           122/80 mmHg Patient Gender: F             HR:           99 bpm. Exam Location:  Inpatient Procedure: 2D Echo, Cardiac Doppler and Color Doppler Indications:    Atrial fibrillation  History:        Patient has no  prior history of Echocardiogram examinations.                 Risk Factors:Hypertension. CA.  Sonographer:    Milda Smart Referring Phys: [id number] AVWUJ LATIF [facility]  Sonographer Comments: Image acquisition challenging due to patient body habitus and Image acquisition challenging due to respiratory motion. Suboptimal windows IMPRESSIONS  1. Left ventricular ejection fraction, by estimation, is 65 to 70%. The left ventricle has normal function. The left ventricle has no regional wall motion abnormalities. Left ventricular diastolic parameters are indeterminate.  2. Right ventricular systolic function is normal. The right ventricular size is normal.  3. Trivial mitral valve regurgitation.  4. The aortic valve is normal in structure. Aortic valve regurgitation is not visualized.  5. The inferior vena cava is normal in size with greater than 50% respiratory variability, suggesting right atrial pressure of 3 mmHg. FINDINGS  Left Ventricle: Left ventricular ejection fraction, by estimation, is 65 to 70%. The left ventricle has normal function. The left ventricle has no regional wall motion abnormalities. The left ventricular internal cavity size was normal in size. There is  no left ventricular hypertrophy. Left ventricular diastolic parameters are indeterminate. Right Ventricle: The right ventricular size is normal. Right vetricular wall thickness was not assessed. Right ventricular systolic  function is normal. Left Atrium: Left atrial size was normal in size. Right Atrium: Right atrial size was normal in size. Pericardium: There is no evidence of pericardial effusion. Mitral Valve: There is mild thickening of the mitral valve leaflet(s). Mild mitral annular calcification. Trivial mitral valve regurgitation. Tricuspid Valve: The tricuspid valve is normal in structure. Tricuspid valve regurgitation is mild. Aortic Valve: The aortic valve is normal in structure. Aortic valve regurgitation is not visualized. Pulmonic Valve: The pulmonic valve was not well visualized. Pulmonic valve regurgitation is not visualized. No evidence of pulmonic stenosis. Aorta: The aortic root and ascending aorta are structurally normal, with no evidence of dilitation. Venous: The inferior vena cava is normal in size with greater than 50% respiratory variability, suggesting right atrial pressure of 3 mmHg. IAS/Shunts: No atrial level shunt detected by color flow Doppler.  LEFT VENTRICLE PLAX 2D LVIDd:         2.90 cm   Diastology LVIDs:         2.20 cm   LV e' medial:    6.20 cm/s LV PW:         0.90 cm   LV E/e' medial:  1.0 LV IVS:        1.00 cm   LV e' lateral:   9.68 cm/s LVOT diam:     1.90 cm   LV E/e' lateral: 0.6 LVOT Area:     2.84 cm  RIGHT VENTRICLE             IVC RV S prime:     16.10 cm/s  IVC diam: 0.80 cm TAPSE (M-mode): 1.9 cm LEFT ATRIUM             Index        RIGHT ATRIUM           Index LA diam:        2.20 cm 1.26 cm/m   RA Area:     11.70 cm LA Vol (A2C):   36.9 ml 21.19 ml/m  RA Volume:   24.50 ml  14.07 ml/m LA Vol (A4C):   26.2 ml 15.04 ml/m LA Biplane Vol: 30.9 ml 17.74 ml/m   AORTA Ao Root diam: 3.20  cm Ao Asc diam:  2.90 cm MV E velocity: 6.20 cm/s  TRICUSPID VALVE                           TR Peak grad:   27.7 mmHg                           TR Mean grad:   19.0 mmHg                           TR Vmax:        263.00 cm/s                           TR Vmean:       206.0 cm/s                             SHUNTS                           Systemic Diam: 1.90 cm Dietrich Pates MD Electronically signed by Dietrich Pates MD Signature Date/Time: 01/28/2023/4:15:23 PM    Final    IR US LIVER BIOPSY  Result Date: 01/25/2023 INDICATION: 88 year old female history of liver mass is indeterminate EXAM: ULTRASOUND GUIDED LIVER LESION BIOPSY COMPARISON:  None Available. MEDICATIONS: None ANESTHESIA/SEDATION: Moderate (conscious) sedation was employed during this procedure. A total of Versed 0.5 mg and Fentanyl 25 mcg was administered intravenously. Moderate Sedation Time: 10 minutes. The patient's level of consciousness and vital signs were monitored continuously by radiology nursing throughout the procedure under my direct supervision. COMPLICATIONS: None immediate. PROCEDURE: Informed written consent was obtained from the patient after a discussion of the risks, benefits and alternatives to treatment. The patient understands and consents the procedure. A timeout was performed prior to the initiation of the procedure. Ultrasound scanning was performed of the right upper abdominal quadrant demonstrates few scattered hypoattenuating cyst in the right lobe, the largest measuring approximately 1 cm the centra aspect of the right lobe. L The right lobe mass was selected for biopsy and the procedure was planned. The right upper abdominal quadrant was prepped and draped in the usual sterile fashion. The overlying soft tissues were anesthetized with 1% lidocaine with epinephrine. A 17 gauge, 6.8 cm co-axial needle was advanced into a peripheral aspect of the lesion. This was followed by 2 core biopsies with an 18 gauge core device under direct ultrasound guidance. The coaxial needle tract was embolized with a small amount of Gel-Foam slurry and superficial hemostasis was obtained with manual compression. Post procedural scanning was negative for definitive area of hemorrhage or additional complication. A dressing was placed. The  patient tolerated the procedure well without immediate post procedural complication. IMPRESSION: Technically successful ultrasound guided core needle biopsy of right lobe liver mass. Marliss Coots, MD [facility] Electronically Signed   By: Marliss Coots M.D.   On: 01/25/2023 13:34   US Abdomen Limited RUQ (LIVER/GB)  Result Date: 01/24/2023 CLINICAL DATA:  Liver lesions. EXAM: ULTRASOUND ABDOMEN LIMITED RIGHT UPPER QUADRANT COMPARISON:  None Available. FINDINGS: Gallbladder: Gallbladder is contracted with wall thickening, nonspecific. No obvious stones. Common bile duct: Diameter: 11 mm. Liver: Hypoechoic lesions identified along the liver. Central focus measures 3.0 x 2.3  x 2.8 cm. Additional smaller foci suggested elsewhere. Portal vein is patent on color Doppler imaging with normal direction of blood flow towards the liver. Other: The sonographer does state evaluation limited as the patient had difficulty tolerating the procedure. IMPRESSION: Multiple subtle hypoechoic liver lesions measuring up to 3.0 cm. Possible a contrast CT or MRI may be of some benefit to further delineate. Contracted gallbladder with wall thickening but this could relate to the level of distention. If there is concern of gallbladder pathology a follow up study with prolonged fasting could be considered as clinically appropriate Electronically Signed   By: Karen Kays M.D.   On: 01/24/2023 12:05   DG Chest Portable 1 View  Result Date: 01/23/2023 CLINICAL DATA:  Altered mental status, weakness. EXAM: PORTABLE CHEST 1 VIEW COMPARISON:  None Available. FINDINGS: The heart size and mediastinal contours are within normal limits. Both lungs are clear. Degenerative changes are seen in the spine and both shoulders. IMPRESSION: No active disease. Electronically Signed   By: Romona Curls M.D.   On: 01/23/2023 11:02    Microbiology: Results for orders placed or performed during  the hospital encounter of 01/23/23  Urine Culture     Status: None   Collection Time: 01/23/23 10:01 AM   Specimen: Urine, Catheterized  Result Value Ref Range Status   Specimen Description   Final    URINE, CATHETERIZED Performed at [facility], [address] [place], [place] [address]    Special Requests   Final    NONE Performed at [facility], [address] [place], [place] [address]    Culture   Final    NO GROWTH Performed at [facility], [address] [place], [place] [address]    Report Status 01/24/2023 FINAL  Final  Culture, blood (routine x 2)     Status: None   Collection Time: 01/23/23 10:20 AM   Specimen: BLOOD  Result Value Ref Range Status   Specimen Description   Final    BLOOD LEFT ANTECUBITAL Performed at [facility], [address] [place], [place] [address]    Special Requests   Final    BOTTLES DRAWN AEROBIC AND ANAEROBIC Blood Culture adequate volume Performed at [facility], [address] [place], [place] [address]    Culture   Final    NO GROWTH 5 DAYS Performed at [facility], [address], [place], [place] [address]    Report Status 01/28/2023 FINAL  Final  Culture, blood (routine x 2)     Status: None   Collection Time: 01/23/23 10:20 AM   Specimen: BLOOD LEFT FOREARM  Result Value Ref Range Status   Specimen Description   Final    BLOOD LEFT FOREARM Performed at [facility], [address] [place], [place] [address]    Special Requests   Final    BOTTLES DRAWN AEROBIC ONLY Blood Culture results may not be optimal due to an inadequate volume of blood received in culture bottles Performed at [facility], [address] [place], [place] [address]    Culture   Final    NO GROWTH 5 DAYS Performed at [facility], [address] [place], [place] [address]    Report Status 01/28/2023 FINAL  Final   Labs: CBC: Recent Labs  Lab  01/25/23 0412 01/26/23 0441 01/27/23 0413 01/27/23 1660 01/28/23 0414 01/29/23  0404  WBC 12.0* 9.8 9.1  --  7.6 5.8  NEUTROABS  --   --   --   --  5.2 3.8  HGB 9.3* 9.3* 10.1* 9.7* 8.7* 8.6*  HCT 30.6* 31.6* 34.4* 32.8* 29.1* 28.6*  MCV 86.7 88.5 89.4  --  88.7 87.5  PLT 195 195 176  --  186 172   Basic Metabolic Panel: Recent Labs  Lab 01/25/23 0412 01/26/23 0441 01/27/23 0413 01/28/23 0414 01/29/23 0404  NA 144 140 141 142 137  K 3.1* 3.8 3.9 3.8 4.0  CL 106 105 106 104 101  CO2 27 26 27 26 29   GLUCOSE 86 78 83 105* 92  BUN 25* 20 19 17 14   CREATININE 0.73 0.50 0.49 0.38* 0.52  CALCIUM 9.2 8.8* 9.0 8.9 8.4*  MG  --   --   --  2.0 1.8  PHOS  --   --   --  1.5* 2.9   Liver Function Tests: Recent Labs  Lab 01/28/23 0414 01/29/23 0404  AST 24 25  ALT 13 13  ALKPHOS 67 64  BILITOT 0.7 0.4  PROT 6.0* 5.9*  ALBUMIN 2.0* 1.9*   CBG: No results for input(s): "GLUCAP" in the last 168 hours.  Discharge time spent: greater than 30 minutes.  Signed: Marguerita Merles, DO [facility] 01/30/2023

## 2023-01-31 ENCOUNTER — Encounter: Payer: Self-pay | Admitting: Oncology

## 2023-01-31 ENCOUNTER — Inpatient Hospital Stay
Admission: RE | Admit: 2023-01-31 | Discharge: 2023-01-31 | Disposition: A | Discharge status: Home / Self Care | Payer: Self-pay | Source: Ambulatory Visit | Attending: Hematology and Oncology | Admitting: Hematology and Oncology

## 2023-01-31 DIAGNOSIS — C549 Malignant neoplasm of corpus uteri, unspecified: Secondary | ICD-10-CM

## 2023-01-31 NOTE — Progress Notes (Signed)
Outside order placed to [facility] CT abdomen pelvis from 01/05/2023 done at [facility].

## 2023-02-03 DIAGNOSIS — R531 Weakness: Secondary | ICD-10-CM

## 2023-02-03 DIAGNOSIS — I10 Essential (primary) hypertension: Secondary | ICD-10-CM

## 2023-02-03 DIAGNOSIS — E440 Moderate protein-calorie malnutrition: Secondary | ICD-10-CM

## 2023-02-03 DIAGNOSIS — C7911 Secondary malignant neoplasm of bladder: Secondary | ICD-10-CM

## 2023-02-27 DEATH — deceased
